# Patient Record
Sex: Male | Born: 1970 | Hispanic: Yes | Marital: Married | Smoking: Former smoker
Health system: Southern US, Community
[De-identification: ages and names within clinical notes are randomized; demographics above are authoritative.]

## PROBLEM LIST (undated history)

## (undated) DIAGNOSIS — I10 Essential (primary) hypertension: Secondary | ICD-10-CM

## (undated) HISTORY — PX: COLONOSCOPY: SHX174

## (undated) HISTORY — DX: Essential (primary) hypertension: I10

## (undated) HISTORY — PX: LUNG SURGERY: SHX703

---

## 2000-01-24 ENCOUNTER — Encounter: Payer: Self-pay | Admitting: Emergency Medicine

## 2000-01-24 ENCOUNTER — Emergency Department (HOSPITAL_COMMUNITY): Admission: EM | Admit: 2000-01-24 | Discharge: 2000-01-24 | Payer: Self-pay | Admitting: Emergency Medicine

## 2000-01-28 ENCOUNTER — Emergency Department (HOSPITAL_COMMUNITY): Admission: EM | Admit: 2000-01-28 | Discharge: 2000-01-28 | Payer: Self-pay | Admitting: Emergency Medicine

## 2000-03-15 ENCOUNTER — Inpatient Hospital Stay (HOSPITAL_COMMUNITY): Admission: EM | Admit: 2000-03-15 | Discharge: 2000-03-26 | Payer: Self-pay | Admitting: Emergency Medicine

## 2000-03-16 ENCOUNTER — Encounter: Payer: Self-pay | Admitting: Internal Medicine

## 2000-03-18 ENCOUNTER — Encounter: Payer: Self-pay | Admitting: Internal Medicine

## 2000-03-20 ENCOUNTER — Encounter: Payer: Self-pay | Admitting: Internal Medicine

## 2000-03-21 ENCOUNTER — Encounter: Payer: Self-pay | Admitting: Thoracic Surgery

## 2000-03-22 ENCOUNTER — Encounter: Payer: Self-pay | Admitting: Thoracic Surgery

## 2000-03-23 ENCOUNTER — Encounter: Payer: Self-pay | Admitting: Thoracic Surgery

## 2000-03-24 ENCOUNTER — Encounter: Payer: Self-pay | Admitting: Thoracic Surgery

## 2000-03-26 ENCOUNTER — Encounter: Payer: Self-pay | Admitting: Thoracic Surgery

## 2000-04-01 ENCOUNTER — Encounter: Admission: RE | Admit: 2000-04-01 | Discharge: 2000-04-01 | Payer: Self-pay | Admitting: Thoracic Surgery

## 2000-04-01 ENCOUNTER — Encounter: Payer: Self-pay | Admitting: Thoracic Surgery

## 2000-04-22 ENCOUNTER — Encounter: Admission: RE | Admit: 2000-04-22 | Discharge: 2000-04-22 | Payer: Self-pay | Admitting: Thoracic Surgery

## 2000-04-22 ENCOUNTER — Encounter: Payer: Self-pay | Admitting: Thoracic Surgery

## 2000-05-28 ENCOUNTER — Encounter: Payer: Self-pay | Admitting: Thoracic Surgery

## 2000-05-28 ENCOUNTER — Encounter: Admission: RE | Admit: 2000-05-28 | Discharge: 2000-05-28 | Payer: Self-pay | Admitting: Thoracic Surgery

## 2000-09-16 ENCOUNTER — Encounter: Admission: RE | Admit: 2000-09-16 | Discharge: 2000-09-16 | Payer: Self-pay | Admitting: Thoracic Surgery

## 2000-09-16 ENCOUNTER — Encounter: Payer: Self-pay | Admitting: Thoracic Surgery

## 2017-09-04 ENCOUNTER — Ambulatory Visit: Payer: Self-pay | Admitting: Family Medicine

## 2018-11-15 DIAGNOSIS — Z20828 Contact with and (suspected) exposure to other viral communicable diseases: Secondary | ICD-10-CM | POA: Diagnosis not present

## 2019-06-30 ENCOUNTER — Other Ambulatory Visit: Payer: Self-pay

## 2019-06-30 ENCOUNTER — Ambulatory Visit (INDEPENDENT_AMBULATORY_CARE_PROVIDER_SITE_OTHER): Payer: BC Managed Care – PPO | Admitting: Emergency Medicine

## 2019-06-30 ENCOUNTER — Encounter: Payer: Self-pay | Admitting: Emergency Medicine

## 2019-06-30 VITALS — BP 155/99 | HR 83 | Temp 98.0°F | Resp 16 | Ht 64.0 in | Wt 149.0 lb

## 2019-06-30 DIAGNOSIS — Z23 Encounter for immunization: Secondary | ICD-10-CM | POA: Diagnosis not present

## 2019-06-30 DIAGNOSIS — F17200 Nicotine dependence, unspecified, uncomplicated: Secondary | ICD-10-CM | POA: Insufficient documentation

## 2019-06-30 DIAGNOSIS — Z7689 Persons encountering health services in other specified circumstances: Secondary | ICD-10-CM

## 2019-06-30 DIAGNOSIS — I10 Essential (primary) hypertension: Secondary | ICD-10-CM

## 2019-06-30 MED ORDER — LOSARTAN POTASSIUM-HCTZ 50-12.5 MG PO TABS: 1.0000 | ORAL_TABLET | Freq: Every day | ORAL | 3 refills | Status: DC

## 2019-06-30 MED ORDER — CHANTIX STARTING MONTH PAK 0.5 MG X 11 & 1 MG X 42 PO TABS: ORAL_TABLET | ORAL | 0 refills | Status: DC

## 2019-06-30 NOTE — Patient Instructions (Addendum)
If you have lab work done today you will be contacted with your lab results within the next 2 weeks.  If you have not heard from [place] then please contact us. The fastest way to get your results is to register for My Chart.   IF you received an x-ray today, you will receive an invoice from [facility]. Please contact [facility] at [phone] with questions or concerns regarding your invoice.   IF you received labwork today, you will receive an invoice from [facility]. Please contact [facility] at [phone] with questions or concerns regarding your invoice.   Our billing staff will not be able to assist you with questions regarding bills from these companies.  You will be contacted with the lab results as soon as they are available. The fastest way to get your results is to activate your My Chart account. Instructions are located on the last page of this paperwork. If you have not heard from [place] regarding the results in 2 weeks, please contact this office.     Cmo sobrellevar la situacin cuando deja de fumar Coping with Quitting Smoking  Dejar de fumar constituye un desafo fsico y mental. Deber enfrentarse al deseo intenso, a los sntomas de abstinencia y a Geographical information systems officer. Antes de dejar de fumar, trabaje con el mdico para elaborar un plan que pueda ayudarlo a sobrellevar la situacin. La preparacin puede ayudarlo a abandonar el hbito y a impedir que caiga en la tentacin. Cmo puedo lidiar con el deseo intenso? El deseo intenso suele durar entre 5y 29mnutos. Si logra atravesar ese perodo, el deseo intenso pasar. Considere las siguientes medidas que lo ayudarn a lidiar con el deseo intenso:  Mantenga la boca ocupada: ? Mastique goma de mascar sin azcar. ? Chupe caramelos duros o un sorbete. ? Cepllese los dientes.  Mantenga las manos y el cuerpo ocupados: ? Cambie de actividad inmediatamente cuando sienta un deseo intenso de fumar. ? Apriete o juegue  con uKeySpan ? Realice alguna actividad o algn pasatiempo, por ejemplo, haga alhajas con cuentas, practique bordado sobre caamazo o trabaje con m[place] ? Modifique su rutina habitual. ? Tome un breve descanso para hacer ejercicio. Haga una caminata rpida o suba y baje las escaleras corriendo. ? Pase tiempo en lugares pblicos en los que no est permitido fumar.  Concntrese en hacer algo bueno o til para otra persona.  Llame a algn amigo o familiar para hablar durante el deseo intenso.  nase a un grupo de apoyo.  Llame a alguna lnea de ayuda, como 1-800-QUIT-NOW.  Hable con su mdico sobre medicamentos que puedan ayudarlo a lidiar con el deseo intenso y fArmed forces technical officerel abandono del hbito. Cmo puedo lidiar con los sntomas de abstinencia? Es posible que su organismo sufra efectos negativos mientras trata de acostumbrarse a la ausencia de nicotina en eSterling Estos efectos se llaman sntomas de abstinencia. Pueden incluir los siguientes:  Ms apetito de lo normal.  Dificultad para concentrarse.  Irritabilidad.  Dificultad para dormir.  Sensacin de depresin.  Inquietud y aAir cabin crew  Deseo intenso de fumar un cigarrillo. Para manejar los sntomas de abstinencia:  Evite los lugares, las personas y las actividades que desencadenen el deseo intenso.  Recuerde por qu quiere dejar de fumar.  Duerma lo suficiente.  Evite el caf y oSid Falconbebidas con cafena. Estos pueden empeorar algunos de los sntomas. CDurant Las sLiberty Mediapueden ser difciles cuando est dejando de fumar, en especial, durante  las primeras semanas. Para manejarlas, puede hacer lo siguiente:  [employer], los bares y Pharmacologist en las que las personas podran fumar.  Evite el alcohol.  Retrese de inmediato del lugar si siente necesidad de fumar.  Explqueles a sus familiares y amigos que est dejando de fumar. Pdales  comprensin y 39.  Planifique actividades con amigos o familiares en las que fumar no sea una opcin. Cules son [employer] que puedo lidiar con el estrs? El deseo de fumar puede causar estrs, y el estrs puede provocarle ganas de fumar. Busque maneras de Monsanto Company. Las tcnicas de relajacin pueden ayudarlo. Por ejemplo:  Respire lenta y profundamente, inhalando por la Doran Durand y exhalando por la boca.  Escuche msica suave y relajante.  Hable con un familiar o un amigo sobre el estrs.  Encienda una vela.  Tome un bao de inmersin o [place].  Piense en un lugar apacible. Cules son Emergency planning/management officer en las que puedo prevenir el aumento de peso? Tenga en cuenta que muchas personas aumentan de peso despus de dejar de fumar. Sin embargo, no les sucede a todos. Para no aumentar de peso, implemente un plan antes de dejar de fumar y resptelo despus de dejar el hbito. El plan debe incluir lo siguiente:  Tener colaciones saludables. Cuando tenga un deseo intenso, quizs lo [employer] lo siguiente: ? Comer palomitas de maz naturales, zanahorias crujientes, apio u otras verduras cortadas. ? Masticar goma de Oceanographer.  Cambiar la [place] de comer: ? Srvase porciones pequeas en las comidas. ? Vance Gather 2y 6comidas pequeas a lo largo del TEFL teacher de 1o 2comidas abundantes por da. ? Sea consciente al comer. Nomire televisin ni haga otras cosas que puedan distraerlo mientras come.  Hacer actividad fsica con regularidad: ? Hgase tiempo para la [employer]. Si no tiene tiempo para un entrenamiento prolongado, haga breves tandas de ejercicio durante 5a 54mnutos varias veces por da. ? HEnvironmental managertipo de ejercicio de fortalecimiento, como levantar pesas, y algn tipo de ejercicio aerbico, como correr o nadar.  Beba abundante cantidad de agua u otras bebidas de bajas caloras o sin caloras. BBonnita Nasutientre 6y 8vasos de agua  por da, o como se lo haya indicado el mdico. Resumen  Dejar de fumar constituye un desafo fsico y mental. Deber enfrentarse al deseo intenso, a los sntomas de abstinencia y a la tentacin de volver a fumar. La preparacin puede ayudarlo a medida que enfrenta estos desafos.  Puede lidiar con el deseo intenso manteniendo la boca ocupada (por ejemplo, masticando goma de mHigher education careers adviser; mClear Channel Communicationscuerpo y las manos ocupados; y lMerchandiser, retaila familiares, a amigos o a una lnea de ayuda para personas que quieren dejar de fumar.  Puede lidiar con los sntomas de abstinencia evitando los lugares donde las personas fuman, evitando las bebidas con cafena y dSt. Joe  Consulte a su mdico sobre las dTeachers Insurance and Annuity Associationpuede prevenir el aumento de peso, eEnglish as a second language teacherestrs y mEva Esta informacin no tiene cMarine scientistel consejo del mdico. Asegrese de hacerle al mdico cualquier pregunta que tenga. Document Revised: 10/15/2016 Document Reviewed: 10/15/2016 Elsevier Patient Education  2[place]  Hipertensin en los adultos Hypertension, Adult El trmino hipertensin es otra forma de denominar a la presin arterial elevada. La presin arterial elevada fuerza al corazn a trabajar ms para bombear la sangre. Esto puede causar problemas con el  paso del tiempo. Una lectura de presin arterial est compuesta por 2 nmeros. Hay un nmero superior (sistlico) sobre un nmero inferior (diastlico). Lo ideal es tener la presin arterial por debajo de 120/80. Las elecciones saludables pueden ayudar a Engineer, materials presin arterial, o tal vez necesite medicamentos para bajarla. Cules son las causas? Se desconoce la causa de esta afeccin. Algunas afecciones pueden estar relacionadas con la presin arterial alta. Qu incrementa el riesgo?  Fumar.  Tener diabetes mellitus tipo 2, colesterol alto, o ambos.  No hacer la cantidad suficiente de actividad  fsica o ejercicio.  Tener sobrepeso.  Consumir mucha grasa, azcar, caloras o sal (sodio) en su dieta.  Beber alcohol en exceso.  Tener una enfermedad renal a largo plazo (crnica).  Tener antecedentes familiares de presin arterial alta.  Edad. Los riesgos aumentan con la edad.  Raza. El riesgo es mayor para las Retail banker.  Sexo. Antes de los 45aos, los hombres corren ms [employer]. Despus de los 65aos, las mujeres corren ms [employer].  Tener apnea obstructiva del sueo.  Estrs. Cules son los signos o los sntomas?  Es posible que la presin arterial alta puede no cause sntomas. La presin arterial muy alta (crisis hipertensiva) puede provocar: ? Dolor de [place]. ? Sensaciones de preocupacin o nerviosismo (ansiedad). ? Falta de aire. ? Hemorragia nasal. ? Sensacin de Engineer, site (nuseas). ? Vmitos. ? Cambios en la forma de ver. ? Dolor muy intenso en el pecho. ? Convulsiones. Cmo se trata?  Esta afeccin se trata haciendo cambios saludables en el estilo de vida, por ejemplo: ? Consumir alimentos saludables. ? Hacer ms ejercicio. ? Beber menos alcohol.  El mdico puede recetarle medicamentos si los cambios en el estilo de vida no son suficientes para Child psychotherapist la presin arterial y si: ? El nmero de arriba est por encima de 130. ? El nmero de abajo est por encima de 80.  Su presin arterial personal ideal puede variar. Siga estas instrucciones en su casa: Comida y bebida   Si se lo dicen, siga el plan de alimentacin de DASH (Dietary Approaches to Stop Hypertension, Maneras de alimentarse para detener la hipertensin). Para seguir este plan: ? Llene la mitad del plato de cada comida con frutas y verduras. ? Llene un cuarto del plato de cada comida con cereales integrales. Los cereales integrales incluyen pasta integral, arroz integral y pan integral. ? Coma y beba productos lcteos  con bajo contenido de grasa, como leche descremada o yogur bajo en grasas. ? Llene un cuarto del plato de cada comida con protenas bajas en grasa (magras). Las protenas bajas en grasa incluyen pescado, pollo sin piel, huevos, frijoles y tofu. ? Evite consumir carne grasa, carne curada y procesada, o pollo con piel. ? Evite consumir alimentos prehechos o procesados.  Consuma menos de 1500 mg de sal por da.  No beba alcohol si: ? El mdico le indica que no lo haga. ? Est embarazada, puede estar embarazada o est tratando de quedar embarazada.  Si bebe alcohol: ? Limite la cantidad que bebe a lo siguiente:  De 0 a 1 medida por da para las mujeres.  De 0 a 2 medidas por da para los hombres. ? Est atento a la cantidad de alcohol que hay en las bebidas que toma. En los [place], una medida equivale a una botella de cerveza de 12oz (361m), un vaso de vino de 5oz (1462m o un vaso de unArdelia Mems  bebida alcohlica de alta graduacin de 1oz (67m). Estilo de vida   Trabaje con su mdico para mantenerse en un peso saludable o para perder peso. Pregntele a su mdico cul es el peso recomendable para usted.  Haga al menos 377mutos de ejercicio la maHartford Financiale la seTuckerEstos pueden incluir caminar, nadar o andar en bicicleta.  Realice al menos 30 minutos de ejercicio que fortalezca sus msculos (ejercicios de resistencia) al menos 3 das a la seWanamingoEstos pueden incluir levantar pesas o hacer [place].  No consuma ningn producto que contenga nicotina o tabaco, como cigarrillos, cigarrillos electrnicos y tabaco de maHigher education careers adviserSi necesita ayuda para dejar de fumar, consulte al mdMeadWestvaco Controle su presin arterial en su casa tal como le indic el mdico.  Concurra a todas las visitas de seguimiento como se lo haya indicado el mdico. Esto es importante. Medicamentos  ToDelphie venta libre y los recetados solamente como se lo haya indicado el mdico. Siga  cuidadosamente las indicaciones.  No omita las dosis de medicamentos para la presin arterial. Los medicamentos pierden eficacia si omite dosis. El hecho de omitir las dosis tambin auSerbial riesgo de otros problemas.  Pregntele a su mdico a qu efectos secundarios o reacciones a los meCareers information officerComunquese con un mdico si:  Piensa que tiene unMexicoeaccin a los medicamentos que est tomando.  Tiene dolores de cabeza frecuentes (recurrentes).  Se siente mareado.  Tiene hinchazn en los tobillos.  Tiene problemas de visin. Solicite ayuda inmediatamente si:  Siente un dolor de cabeza muy intenso.  Empieza a sentirse desorientado (confundido).  Se siente dbil o adormecido.  Siente que va a desmayarse.  Tiene un dolor muy intenso en las siguientes zonas: ? Pecho. ? Vientre (abdomen).  Vomita ms de una vez.  Tiene dificultad para respirar. Resumen  El trmino hipertensin es otra forma de denominar a la presin arterial elevada.  La presin arterial elevada fuerza al corazn a trabajar ms para bombear la sangre.  Para la maComcastuna presin arterial normal es menor que 120/80.  Las decisiones saludables pueden ayudarle a disminuir su presin arterial. Si no puede bajar su presin arterial mediante decisiones saludables, es posible que deba tomar medicamentos. Esta informacin no tiene coMarine scientistl consejo del mdico. Asegrese de hacerle al mdico cualquier pregunta que tenga. Document Revised: 12/10/2017 Document Reviewed: 12/10/2017 Elsevier Patient Education  20[place]

## 2019-06-30 NOTE — Assessment & Plan Note (Signed)
Willing to quit.  Will start Chantix.  Follow-up in 4 weeks.

## 2019-06-30 NOTE — Assessment & Plan Note (Signed)
Uncontrolled hypertension.  We will start losartan/hydrochlorothiazide 50/12.5 mg daily.  Follow-up in 4 weeks.

## 2019-06-30 NOTE — Progress Notes (Signed)
Lee Watson 49 y.o.   Chief Complaint  Patient presents with  . Establish Care    per patient want to stop smoking  . Hypertension    per pt was medication     HISTORY OF PRESENT ILLNESS: This is a 49 y.o. male first visit to this office, here to establish care with me. Has history of hypertension but is presently on no medication. Current smoker.  Wants to quit.  Inquiring about medications. Chronic insomnia. Physical work 5 days/week.  No chest pain on exertion.  No dyspnea on exertion. Occasional sciatica. No other complaints or medical concerns today.  HPI   Prior to Admission medications   Medication Sig Start Date End Date Taking? Authorizing Provider  aspirin 325 MG tablet Take 325 mg by mouth as needed.   Yes [provider]    No Known Allergies  There are no problems to display for this patient.   History reviewed. No pertinent past medical history.  History reviewed. No pertinent surgical history.  Social History   Socioeconomic History  . Marital status: Married    Spouse name: Not on file  . Number of children: Not on file  . Years of education: Not on file  . Highest education level: Not on file  Occupational History  . Not on file  Tobacco Use  . Smoking status: Current Every Day Smoker    Years: 25.00    Types: Cigarettes  . Smokeless tobacco: Never Used  . Tobacco comment: 8-9 a day cigarettes  Substance and Sexual Activity  . Alcohol use: Yes    Comment: cans of beers on weekends  . Drug use: Never  . Sexual activity: Not on file  Other Topics Concern  . Not on file  Social History Narrative  . Not on file   Social Determinants of Health   Financial Resource Strain:   . Difficulty of Paying Living Expenses:   Food Insecurity:   . Worried About Charity fundraiser in the Last Year:   . Arboriculturist in the Last Year:   Transportation Needs:   . Film/video editor (Medical):   Marland Kitchen Lack of Transportation  (Non-Medical):   Physical Activity:   . Days of Exercise per Week:   . Minutes of Exercise per Session:   Stress:   . Feeling of Stress :   Social Connections:   . Frequency of Communication with Friends and Family:   . Frequency of Social Gatherings with Friends and Family:   . Attends Religious Services:   . Active Member of Clubs or Organizations:   . Attends Archivist Meetings:   Marland Kitchen Marital Status:   Intimate Partner Violence:   . Fear of Current or Ex-Partner:   . Emotionally Abused:   Marland Kitchen Physically Abused:   . Sexually Abused:     History reviewed. No pertinent family history.   Review of Systems  Constitutional: Negative.  Negative for chills and fever.  HENT: Negative.  Negative for congestion and sore throat.   Respiratory: Negative.  Negative for cough and shortness of breath.   Cardiovascular: Negative.  Negative for chest pain and palpitations.  Gastrointestinal: Negative.  Negative for abdominal pain, blood in stool, diarrhea, nausea and vomiting.  Genitourinary: Negative.  Negative for dysuria and hematuria.  Musculoskeletal: Positive for back pain.  Skin: Negative.  Negative for rash.  Neurological: Negative.  Negative for dizziness and headaches.  Psychiatric/Behavioral: The patient has insomnia.  All other systems reviewed and are negative.  Vitals:   06/30/19 1518  BP: (!) 155/99  Pulse: 83  Resp: 16  Temp: 98 F (36.7 C)  SpO2: 97%     Physical Exam Vitals reviewed.  Constitutional:      Appearance: Normal appearance.  HENT:     Head: Normocephalic.  Eyes:     Extraocular Movements: Extraocular movements intact.     Conjunctiva/sclera: Conjunctivae normal.     Pupils: Pupils are equal, round, and reactive to light.  Neck:     Vascular: No carotid bruit.  Cardiovascular:     Rate and Rhythm: Normal rate and regular rhythm.     Pulses: Normal pulses.     Heart sounds: Normal heart sounds.  Pulmonary:     Effort: Pulmonary  effort is normal.     Breath sounds: Normal breath sounds.  Abdominal:     General: There is no distension.     Palpations: Abdomen is soft.     Tenderness: There is no abdominal tenderness.  Musculoskeletal:        General: Normal range of motion.     Cervical back: Normal range of motion and neck supple.  Lymphadenopathy:     Cervical: No cervical adenopathy.  Skin:    General: Skin is warm and dry.  Neurological:     General: No focal deficit present.     Mental Status: He is alert and oriented to person, place, and time.  Psychiatric:        Mood and Affect: Mood normal.        Behavior: Behavior normal.    A total of 45 minutes was spent with the patient, greater than 50% of which was in counseling/coordination of care regarding chronic medical problems, hypertension and treatment, dangers of smoking and cardiovascular risks associated with these conditions, need for blood work today, review of all new medications Chantix and losartan, diet and nutrition, prognosis and need for follow-up in 4 weeks.   ASSESSMENT & PLAN: Current smoker Willing to quit.  Will start Chantix.  Follow-up in 4 weeks.  Essential hypertension Uncontrolled hypertension.  We will start losartan/hydrochlorothiazide 50/12.5 mg daily.  Follow-up in 4 weeks.  [name] was seen today for establish care and hypertension.  Diagnoses and all orders for this visit:  Essential hypertension -     CBC with Differential/Platelet -     Comprehensive metabolic panel -     Lipid panel -     Hemoglobin A1c -     losartan-hydrochlorothiazide (HYZAAR) 50-12.5 MG tablet; Take 1 tablet by mouth daily.  Encounter to establish care  Current smoker -     varenicline (CHANTIX STARTING MONTH PAK) 0.5 MG X 11 & 1 MG X 42 tablet; Take one 0.5 mg tablet by mouth once daily for 3 days, then increase to one 0.5 mg tablet twice daily for 4 days, then increase to one 1 mg tablet twice daily.  Need for  diphtheria-tetanus-pertussis (Tdap) vaccine  Other orders -     Tdap vaccine greater than or equal to 7yo IM    Patient Instructions       If you have lab work done today you will be contacted with your lab results within the next 2 weeks.  If you have not heard from [place] then please contact us. The fastest way to get your results is to register for My Chart.   IF you received an x-ray today, you will receive an invoice  from [facility]. Please contact [facility] at [phone] with questions or concerns regarding your invoice.   IF you received labwork today, you will receive an invoice from [facility]. Please contact [facility] at [phone] with questions or concerns regarding your invoice.   Our billing staff will not be able to assist you with questions regarding bills from these companies.  You will be contacted with the lab results as soon as they are available. The fastest way to get your results is to activate your My Chart account. Instructions are located on the last page of this paperwork. If you have not heard from [place] regarding the results in 2 weeks, please contact this office.     Cmo sobrellevar la situacin cuando deja de fumar Coping with Quitting Smoking  Dejar de fumar constituye un desafo fsico y mental. Deber enfrentarse al deseo intenso, a los sntomas de abstinencia y a Geographical information systems officer. Antes de dejar de fumar, trabaje con el mdico para elaborar un plan que pueda ayudarlo a sobrellevar la situacin. La preparacin puede ayudarlo a abandonar el hbito y a impedir que caiga en la tentacin. Cmo puedo lidiar con el deseo intenso? El deseo intenso suele durar entre 5y 33mnutos. Si logra atravesar ese perodo, el deseo intenso pasar. Considere las siguientes medidas que lo ayudarn a lidiar con el deseo intenso:  Mantenga la boca ocupada: ? Mastique goma de mascar sin azcar. ? Chupe caramelos duros o un sorbete. ? Cepllese los  dientes.  Mantenga las manos y el cuerpo ocupados: ? Cambie de actividad inmediatamente cuando sienta un deseo intenso de fumar. ? Apriete o juegue con uKeySpan ? Realice alguna actividad o algn pasatiempo, por ejemplo, haga alhajas con cuentas, practique bordado sobre caamazo o trabaje con mBennett ? Modifique su rutina habitual. ? Tome un breve descanso para hacer ejercicio. Haga una caminata rpida o suba y baje las escaleras corriendo. ? Pase tiempo en lugares pblicos en los que no est permitido fumar.  Concntrese en hacer algo bueno o til para otra persona.  Llame a algn amigo o familiar para hablar durante el deseo intenso.  nase a un grupo de apoyo.  Llame a alguna lnea de ayuda, como 1-800-QUIT-NOW.  Hable con su mdico sobre medicamentos que puedan ayudarlo a lidiar con el deseo intenso y fArmed forces technical officerel abandono del hbito. Cmo puedo lidiar con los sntomas de abstinencia? Es posible que su organismo sufra efectos negativos mientras trata de acostumbrarse a la ausencia de nicotina en eStouchsburg Estos efectos se llaman sntomas de abstinencia. Pueden incluir los siguientes:  Ms apetito de lo normal.  Dificultad para concentrarse.  Irritabilidad.  Dificultad para dormir.  Sensacin de depresin.  Inquietud y aAir cabin crew  Deseo intenso de fumar un cigarrillo. Para manejar los sntomas de abstinencia:  Evite los lugares, las personas y las actividades que desencadenen el deseo intenso.  Recuerde por qu quiere dejar de fumar.  Duerma lo suficiente.  Evite el caf y oSid Falconbebidas con cafena. Estos pueden empeorar algunos de los sntomas. CSewall's Point Las sLiberty Mediapueden ser difciles cuando est dejando de fumar, en especial, durante las primeras semanas. Para manejarlas, puede hacer lo siguiente:  ECharles Schwab los bares y oPharmacologisten las que las personas podran fumar.  Evite el  alcohol.  Retrese de inmediato del lugar si siente necesidad de fumar.  Explqueles a sus familiares y amigos que est dejando de fumar. Pdales comprensin y a64  Planifique actividades  con amigos o familiares en las que fumar no sea una opcin. Cules son [employer] Co que puedo lidiar con el estrs? El deseo de fumar puede causar estrs, y el estrs puede provocarle ganas de fumar. Busque maneras de Monsanto Company. Las tcnicas de relajacin pueden ayudarlo. Por ejemplo:  Respire lenta y profundamente, inhalando por la Doran Durand y exhalando por la boca.  Escuche msica suave y relajante.  Hable con un familiar o un amigo sobre el estrs.  Encienda una vela.  Tome un bao de inmersin o [place].  Piense en un lugar apacible. Cules son Emergency planning/management officer en las que puedo prevenir el aumento de peso? Tenga en cuenta que muchas personas aumentan de peso despus de dejar de fumar. Sin embargo, no les sucede a todos. Para no aumentar de peso, implemente un plan antes de dejar de fumar y resptelo despus de dejar el hbito. El plan debe incluir lo siguiente:  Tener colaciones saludables. Cuando tenga un deseo intenso, quizs lo [employer] lo siguiente: ? Comer palomitas de maz naturales, zanahorias crujientes, apio u otras verduras cortadas. ? Masticar goma de Oceanographer.  Cambiar la [place] de comer: ? Srvase porciones pequeas en las comidas. ? Vance Gather 2y 6comidas pequeas a lo largo del TEFL teacher de 1o 2comidas abundantes por da. ? Sea consciente al comer. Nomire televisin ni haga otras cosas que puedan distraerlo mientras come.  Hacer actividad fsica con regularidad: ? Hgase tiempo para la [employer]. Si no tiene tiempo para un entrenamiento prolongado, haga breves tandas de ejercicio durante 5a 93mnutos varias veces por da. ? HEnvironmental managertipo de ejercicio de fortalecimiento, como levantar pesas, y algn tipo de  ejercicio aerbico, como correr o nadar.  Beba abundante cantidad de agua u otras bebidas de bajas caloras o sin caloras. BBonnita Nasutientre 6y 8vasos de agua por da, o como se lo haya indicado el mdico. Resumen  Dejar de fumar constituye un desafo fsico y mental. Deber enfrentarse al deseo intenso, a los sntomas de abstinencia y a la tentacin de volver a fumar. La preparacin puede ayudarlo a medida que enfrenta estos desafos.  Puede lidiar con el deseo intenso manteniendo la boca ocupada (por ejemplo, masticando goma de mHigher education careers adviser; mClear Channel Communicationscuerpo y las manos ocupados; y lMerchandiser, retaila familiares, a amigos o a una lnea de ayuda para personas que quieren dejar de fumar.  Puede lidiar con los sntomas de abstinencia evitando los lugares donde las personas fuman, evitando las bebidas con cafena y dStone [place]  Consulte a su mdico sobre las dTeachers Insurance and Annuity Associationpuede prevenir el aumento de peso, eEnglish as a second language teacherestrs y mMount Sterling Esta informacin no tiene cMarine scientistel consejo del mdico. Asegrese de hacerle al mdico cualquier pregunta que tenga. Document Revised: 10/15/2016 Document Reviewed: 10/15/2016 Elsevier Patient Education  2[place]  Hipertensin en los adultos Hypertension, Adult El trmino hipertensin es otra forma de denominar a la presin arterial elevada. La presin arterial elevada fuerza al corazn a trabajar ms para bombear la sangre. Esto puede causar problemas con el paso del tTabiona Una lectura de presin arterial est compuesta por 2 nmeros. Hay un nmero superior (sistlico) sobre un nmero inferior (diastlico). Lo ideal es tener la presin arterial por debajo de 120/80. Las elecciones saludables pueden ayudar a bEngineer, materialspresin arterial, o tal vez necesite medicamentos para bajarla. Cules son las causas? Se desconoce la causa de esta  afeccin. Algunas afecciones pueden estar relacionadas con la presin arterial  alta. Qu incrementa el riesgo?  Fumar.  Tener diabetes mellitus tipo 2, colesterol alto, o ambos.  No hacer la cantidad suficiente de actividad fsica o ejercicio.  Tener sobrepeso.  Consumir mucha grasa, azcar, caloras o sal (sodio) en su dieta.  Beber alcohol en exceso.  Tener una enfermedad renal a largo plazo (crnica).  Tener antecedentes familiares de presin arterial alta.  Edad. Los riesgos aumentan con la edad.  Raza. El riesgo es mayor para las Retail banker.  Sexo. Antes de los 45aos, los hombres corren ms [employer]. Despus de los 65aos, las mujeres corren ms [employer]M [employer].  Tener apnea obstructiva del sueo.  Estrs. Cules son los signos o los sntomas?  Es posible que la presin arterial alta puede no cause sntomas. La presin arterial muy alta (crisis hipertensiva) puede provocar: ? Dolor de [place]. ? Sensaciones de preocupacin o nerviosismo (ansiedad). ? Falta de aire. ? Hemorragia nasal. ? Sensacin de Engineer, site (nuseas). ? Vmitos. ? Cambios en la forma de ver. ? Dolor muy intenso en el pecho. ? Convulsiones. Cmo se trata?  Esta afeccin se trata haciendo cambios saludables en el estilo de vida, por ejemplo: ? Consumir alimentos saludables. ? Hacer ms ejercicio. ? Beber menos alcohol.  El mdico puede recetarle medicamentos si los cambios en el estilo de vida no son suficientes para Child psychotherapist la presin arterial y si: ? El nmero de arriba est por encima de 130. ? El nmero de abajo est por encima de 80.  Su presin arterial personal ideal puede variar. Siga estas instrucciones en su casa: Comida y bebida   Si se lo dicen, siga el plan de alimentacin de DASH (Dietary Approaches to Stop Hypertension, Maneras de alimentarse para detener la hipertensin). Para seguir este plan: ? Llene la mitad del plato de cada comida con frutas y verduras. ? Llene un cuarto del plato de  cada comida con cereales integrales. Los cereales integrales incluyen pasta integral, arroz integral y pan integral. ? Coma y beba productos lcteos con bajo contenido de grasa, como leche descremada o yogur bajo en grasas. ? Llene un cuarto del plato de cada comida con protenas bajas en grasa (magras). Las protenas bajas en grasa incluyen pescado, pollo sin piel, huevos, frijoles y tofu. ? Evite consumir carne grasa, carne curada y procesada, o pollo con piel. ? Evite consumir alimentos prehechos o procesados.  Consuma menos de 1500 mg de sal por da.  No beba alcohol si: ? El mdico le indica que no lo haga. ? Est embarazada, puede estar embarazada o est tratando de quedar embarazada.  Si bebe alcohol: ? Limite la cantidad que bebe a lo siguiente:  De 0 a 1 medida por da para las mujeres.  De 0 a 2 medidas por da para los hombres. ? Est atento a la cantidad de alcohol que hay en las bebidas que toma. En los [place], una medida equivale a una botella de cerveza de 12oz (356m), un vaso de vino de 5oz (1468m o un vaso de una bebida alcohlica de alta graduacin de 1oz (4421m Estilo de vida   Trabaje con su mdico para mantenerse en un peso saludable o para perder peso. Pregntele a su mdico cul es el peso recomendable para usted.  Haga al menos 41m42mos de ejercicio la mayoHartford Financialla semaTennanttos pueden incluir caminar, nadar o andar en  bicicleta.  Realice al menos 30 minutos de ejercicio que fortalezca sus msculos (ejercicios de resistencia) al menos 3 das a la [place]. Estos pueden incluir levantar pesas o hacer [place].  No consuma ningn producto que contenga nicotina o tabaco, como cigarrillos, cigarrillos electrnicos y tabaco de Higher education careers adviser. Si necesita ayuda para dejar de fumar, consulte al MeadWestvaco.  Controle su presin arterial en su casa tal como le indic el mdico.  Concurra a todas las visitas de seguimiento como se lo haya indicado el  mdico. Esto es importante. Medicamentos  Delphi de venta libre y los recetados solamente como se lo haya indicado el mdico. Siga cuidadosamente las indicaciones.  No omita las dosis de medicamentos para la presin arterial. Los medicamentos pierden eficacia si omite dosis. El hecho de omitir las dosis tambin [place] el riesgo de otros problemas.  Pregntele a su mdico a qu efectos secundarios o reacciones a los Careers information officer. Comunquese con un mdico si:  Piensa que tiene [place] reaccin a los medicamentos que est tomando.  Tiene dolores de cabeza frecuentes (recurrentes).  Se siente mareado.  Tiene hinchazn en los tobillos.  Tiene problemas de visin. Solicite ayuda inmediatamente si:  Siente un dolor de cabeza muy intenso.  Empieza a sentirse desorientado (confundido).  Se siente dbil o adormecido.  Siente que va a desmayarse.  Tiene un dolor muy intenso en las siguientes zonas: ? Pecho. ? Vientre (abdomen).  Vomita ms de una vez.  Tiene dificultad para respirar. Resumen  El trmino hipertensin es otra forma de denominar a la presin arterial elevada.  La presin arterial elevada fuerza al corazn a trabajar ms para bombear la sangre.  Para la [employer]cast, una presin arterial normal es menor que 120/80.  Las decisiones saludables pueden ayudarle a disminuir su presin arterial. Si no puede bajar su presin arterial mediante decisiones saludables, es posible que deba tomar medicamentos. Esta informacin no tiene Marine scientist el consejo del mdico. Asegrese de hacerle al mdico cualquier pregunta que tenga. Document Revised: 12/10/2017 Document Reviewed: 12/10/2017 Elsevier Patient Education  2020 Elsevier Inc.      Agustina Caroli, MD [facility]

## 2019-07-01 LAB — COMPREHENSIVE METABOLIC PANEL
ALT: 31 IU/L (ref 0–44)
AST: 29 IU/L (ref 0–40)
Albumin/Globulin Ratio: 2.1 (ref 1.2–2.2)
Albumin: 4.9 g/dL (ref 3.8–4.9)
Alkaline Phosphatase: 92 IU/L (ref 39–117)
BUN/Creatinine Ratio: 15 (ref 9–20)
BUN: 15 mg/dL (ref 6–24)
Bilirubin Total: 0.3 mg/dL (ref 0.0–1.2)
CO2: 25 mmol/L (ref 20–29)
Calcium: 9.7 mg/dL (ref 8.7–10.2)
Chloride: 103 mmol/L (ref 96–106)
Creatinine, Ser: 0.97 mg/dL (ref 0.76–1.27)
GFR calc Af Amer: 104 mL/min/{1.73_m2} (ref >59)
GFR calc non Af Amer: 90 mL/min/{1.73_m2} (ref >59)
Globulin, Total: 2.3 g/dL (ref 1.5–4.5)
Glucose: 88 mg/dL (ref 65–99)
Potassium: 4.3 mmol/L (ref 3.5–5.2)
Sodium: 142 mmol/L (ref 134–144)
Total Protein: 7.2 g/dL (ref 6.0–8.5)

## 2019-07-01 LAB — CBC WITH DIFFERENTIAL/PLATELET
Basophils Absolute: 0.1 10*3/uL (ref 0.0–0.2)
Basos: 1 %
EOS (ABSOLUTE): 0.1 10*3/uL (ref 0.0–0.4)
Eos: 1 %
Hematocrit: 43.9 % (ref 37.5–51.0)
Hemoglobin: 15 g/dL (ref 13.0–17.7)
Immature Grans (Abs): 0.1 10*3/uL (ref 0.0–0.1)
Immature Granulocytes: 1 %
Lymphocytes Absolute: 1.9 10*3/uL (ref 0.7–3.1)
Lymphs: 18 %
MCH: 30.8 pg (ref 26.6–33.0)
MCHC: 34.2 g/dL (ref 31.5–35.7)
MCV: 90 fL (ref 79–97)
Monocytes Absolute: 1.1 10*3/uL — ABNORMAL HIGH (ref 0.1–0.9)
Monocytes: 10 %
Neutrophils Absolute: 7.8 10*3/uL — ABNORMAL HIGH (ref 1.4–7.0)
Neutrophils: 69 %
Platelets: 290 10*3/uL (ref 150–450)
RBC: 4.87 x10E6/uL (ref 4.14–5.80)
RDW: 12.5 % (ref 11.6–15.4)
WBC: 11.1 10*3/uL — ABNORMAL HIGH (ref 3.4–10.8)

## 2019-07-01 LAB — LIPID PANEL
Chol/HDL Ratio: 2.2 ratio (ref 0.0–5.0)
Cholesterol, Total: 153 mg/dL (ref 100–199)
HDL: 70 mg/dL (ref >39)
LDL Chol Calc (NIH): 69 mg/dL (ref 0–99)
Triglycerides: 71 mg/dL (ref 0–149)
VLDL Cholesterol Cal: 14 mg/dL (ref 5–40)

## 2019-07-01 LAB — HEMOGLOBIN A1C
Est. average glucose Bld gHb Est-mCnc: 108 mg/dL
Hgb A1c MFr Bld: 5.4 % (ref 4.8–5.6)

## 2019-07-05 NOTE — Progress Notes (Signed)
Spoke with pt to give normal lab results, also sending letter.

## 2019-07-05 NOTE — Progress Notes (Signed)
Called pt, lm to return call for lab results

## 2019-07-28 ENCOUNTER — Encounter: Payer: Self-pay | Admitting: Emergency Medicine

## 2019-07-28 ENCOUNTER — Ambulatory Visit (INDEPENDENT_AMBULATORY_CARE_PROVIDER_SITE_OTHER): Payer: BC Managed Care – PPO | Admitting: Emergency Medicine

## 2019-07-28 ENCOUNTER — Other Ambulatory Visit: Payer: Self-pay

## 2019-07-28 VITALS — BP 131/87 | HR 67 | Temp 98.3°F | Resp 16 | Ht 65.0 in | Wt 148.0 lb

## 2019-07-28 DIAGNOSIS — I10 Essential (primary) hypertension: Secondary | ICD-10-CM

## 2019-07-28 DIAGNOSIS — F17200 Nicotine dependence, unspecified, uncomplicated: Secondary | ICD-10-CM

## 2019-07-28 MED ORDER — VARENICLINE TARTRATE 1 MG PO TABS: 1.0000 mg | ORAL_TABLET | Freq: Two times a day (BID) | ORAL | 3 refills | Status: AC

## 2019-07-28 NOTE — Patient Instructions (Addendum)
If you have lab work done today you will be contacted with your lab results within the next 2 weeks.  If you have not heard from [place] then please contact us. The fastest way to get your results is to register for My Chart.   IF you received an x-ray today, you will receive an invoice from [facility]. Please contact [facility] at [phone] with questions or concerns regarding your invoice.   IF you received labwork today, you will receive an invoice from [place]. Please contact [facility] at [phone] with questions or concerns regarding your invoice.   Our billing staff will not be able to assist you with questions regarding bills from these companies.  You will be contacted with the lab results as soon as they are available. The fastest way to get your results is to activate your My Chart account. Instructions are located on the last page of this paperwork. If you have not heard from [place] regarding the results in 2 weeks, please contact this office.     Plan de alimentacin DASH DASH Eating Plan DASH es la sigla en ingls de "Enfoques Alimentarios para Detener la Hipertensin" (Dietary Approaches to Stop Hypertension). El plan de alimentacin DASH ha demostrado bajar la presin arterial elevada (hipertensin). Tambin puede reducir [employer] de diabetes tipo 2, enfermedad cardaca y accidente cerebrovascular. Este plan tambin puede ayudar a Horticulturist, commercial. Consejos para seguir este plan  Pautas generales  Evite ingerir ms de 2,300 mg (miligramos) de sal (sodio) por da. Si tiene hipertensin, es posible que necesite reducir la ingesta de sodio a 1,500 mg por da.  Limite el consumo de alcohol a no ms de 20mdida por da si es mujer y no est [place] y 269midas por da si es hombre. Una medida equivale a 12oz (35524mde cerveza, 5oz (148m75me vino o 1oz (44ml39m bebidas alcohlicas de alta graduacin.  Trabaje con su mdico para mantener un peso  saludable o perder peso.[place] Mediagntele cul es el peso recomendado para usted.  Realice al menos 30 minutos de ejercicio que haga que se acelere su corazn (ejercicio aerbiArboriculturistmayorHartford Financiala semanCasas Adobesas actividades pueden incluir caminar, nadar o andar en bicicleta.  Trabaje con su mdico o especialista en alimentacin y nutricin (nutricionista) para ajustar su plan alimentario a sus necesidades calricas personales. Lectura de las etiquetas de los alimentos   Verifique en las etiquetas de los alimentos, la cantidad de sodio por porcin. Elija alimentos con menos del 5 por ciento del valor diario de sodio. Generalmente, los alimentos con menos de 300 mg de sodio por porcin se encuadran dentro de este plan alimentario.  Para encontrar cereales integrales, busque la palabra "integral" como primera palabra en la lista de ingredientes. De compras  Compre productos en los que en su etiqueta diga: "bajo contenido de sodio" o "sin agregado de sal".  Compre alimentos frescos. Evite los alimentos enlatados y comidas precocidas o congeladas. Coccin  Evite agregar sal cuando cocine. Use hierbas o aderezos sin sal, en lugar de sal de mesa o sal marina. Consulte al mdico o farmacutico antes de usar sustitutos de la sal.  No fra los alimentos. A la hora de cocinar los alimentos opte por hornearlos, hervirlos, grillarlos y asarlos a la paAdministrator, artscine con aceites cardiosaludables, como oliva, canola, soja o girasol. Planificacin de las comidas  Consuma una dieta equilibrada, que incluya lo siguiente: ? 5o ms porciones de frutas y verduras por  da. Trate de que la mitad del plato de cada comida sean frutas y verduras. ? Hasta 6 u 8 porciones de cereales integrales por da. ? Menos de 6 onzas de carne, aves o pescado Games developer. Una porcin de 3 onzas de carne tiene casi el mismo tamao que un mazo de cartas. Un huevo equivale a 1 onza. ? Dos porciones de productos lcteos  descremados por Training and development officer. ? Una porcin de frutos secos, semillas o frijoles 5 veces por semana. ? Grasas cardiosaludables. Las grasas saludables llamadas cidos grasos omega-3 se encuentran en alimentos como semillas de lino y pescados de agua fra, como por ejemplo, sardinas, salmn y caballa.  Limite la cantidad que ingiere de los siguientes alimentos: ? Alimentos enlatados o envasados. ? Alimentos con alto contenido de grasa trans, como alimentos fritos. ? Alimentos con alto contenido de grasa saturada, como carne con grasa. ? Dulces, postres, bebidas azucaradas y otros alimentos con azcar agregada. ? Productos lcteos enteros.  No le agregue sal a los alimentos antes de probarlos.  Trate de comer al menos 2 comidas vegetarianas por semana.  Consuma ms comida casera y menos de restaurante, de bufs y comida rpida.  Cuando coma en un restaurante, pida que preparen su comida con menos sal o, en lo posible, sin nada de sal. Qu alimentos se recomiendan? Los alimentos enumerados a continuacin no constituyen Furniture conservator/restorer. Hable con el nutricionista sobre las mejores opciones alimenticias para usted. Cereales Pan de salvado o integral. Pasta de salvado o integral. Arroz integral. Avena. Quinua. Trigo burgol. Cereales integrales y con bajo contenido de sodio. Pan pita. Galletitas de [place] con bajo contenido de [place] y [place]. Tortillas de [place] integral. Verduras Verduras frescas o congeladas (crudas, al vapor, asadas o grilladas). Jugos de tomate y verduras con bajo contenido de sodio o reducidos en sodio. Salsa y pasta de tomate con bajo contenido de sodio o reducidas en sodio. Verduras enlatadas con bajo contenido de sodio o reducidas en sodio. Frutas Todas las frutas frescas, congeladas o disecadas. Frutas enlatadas en jugo natural (sin agregado de azcar). Carne y otros alimentos proteicos Pollo o pavo sin piel. Carne de pollo o de [place]. Cerdo desgrasado. Pescado y [employer].  Claras de huevo. Porotos, guisantes o lentejas secos. Frutos secos, mantequilla de frutos secos y semillas sin sal. Frijoles enlatados sin sal. Cortes de carne vacuna magra, desgrasada. Embutidos magros, con bajo contenido de [place]. Lcteos Leche descremada (1%) o descremada. Quesos sin grasa, con bajo contenido de grasa o descremados. Queso blanco o ricota sin grasa, con bajo contenido de [place]. Yogur semidescremado o descremado. Queso con bajo contenido de [place] y [place]. Grasas y [employer] untables que no contengan grasas trans. Aceite vegetal. Lubertha Basque y aderezos para ensaladas livianos o con bajo contenido de grasas (reducidos en sodio). Aceite de canola, crtamo, oliva, soja y [place]. [place]. Condimentos y otros alimentos Hierbas. Especias. Mezclas de condimentos sin sal. Palomitas de maz y pretzels sin sal. Dulces con bajo contenido de grasas. Qu alimentos no se recomiendan? Los alimentos enumerados a continuacin no constituyen Furniture conservator/restorer. Hable con el nutricionista sobre las mejores opciones alimenticias para usted. Cereales Productos de panificacin hechos con grasa, como medialunas, magdalenas y algunos panes. Comidas con arroz o pasta seca listas para usar. Verduras Verduras con crema o fritas. Verduras en [place]. Verduras enlatadas regulares (que no sean con bajo contenido de sodio o reducidas en sodio). Pasta y salsa de tomates enlatadas regulares (que no sean  con bajo contenido de sodio o reducidas en sodio). Jugos de tomate y verduras regulares (que no sean con bajo contenido de sodio o reducidos en sodio). Pepinillos. Aceitunas. Frutas Fruta enlatada en almbar liviano o espeso. Frutas cocidas en aceite. Frutas con salsa de crema o manteca. Carne y otros alimentos proteicos Cortes de carne con grasa. Costillas. Carne frita. [place]. Salchichas. [place] y otras carnes procesadas. Salame. Panceta. Perros calientes (hotdogs). Salchicha de cerdo. Frutos  secos y semillas con sal. Frijoles enlatados con agregado de sal. Pescado enlatado o ahumado. Huevos enteros o yemas. Pollo o pavo con piel. Lcteos Leche entera o al 2%, crema y mitad leche y mitad crema. Queso crema entero o con toda su grasa. Yogur entero o endulzado. Quesos con toda su grasa. Sustitutos de cremas no lcteas. Coberturas batidas. Quesos para untar y quesos procesados. Grasas y aceites Mant[place]. Margarina en barra. Manteca de cerdo. Materia grasa. Mantequilla clarificada. Grasa de panceta. Aceites tropicales como aceite de coco, palmiste o palma. Condimentos y otros alimentos Palomitas de maz y pretzels con sal. Sal de cebolla, sal de ajo, sal condimentada, sal de mesa y sal marina. Salsa [place]. Salsa trtara. Salsa barbacoa. Salsa teriyaki. Salsa de soja, incluso la que tiene contenido reducido de sodio. Salsa de carne. Salsas en lata y envasadas. Salsa de pescado. Salsa de ostras. Salsa rosada. Rbano picante envasado. [place]. [place]. Saborizantes y tiernizantes para carne. Caldo en cubitos. Salsa picante y salsa tabasco. Escabeches envasados o ya preparados. Aderezos para tacos prefabricados o envasados. Salsas. Aderezos comunes para ensalada. Dnde encontrar ms informacin:  Instituto Nacional del Corazn, los Pulmones y la Sangre (National Heart, Lung, and Blood Institute): www.nhlbi.nih.gov  Asociacin Estadounidense del Corazn (American Heart Association): www.heart.org Resumen  El plan de alimentacin DASH ha demostrado bajar la presin arterial elevada (hipertensin). Tambin puede reducir el riesgo de diabetes tipo 2, enfermedad cardaca y accidente cerebrovascular.  Con el plan de alimentacin DASH, deber limitar el consumo de sal (sodio) a 2,300 mg por da. Si tiene hipertensin, es posible que necesite reducir la ingesta de sodio a 1,500 mg por da.  Cuando siga el plan de alimentacin DASH, trate de comer ms frutas frescas y verduras, cereales  integrales, carnes magras, lcteos descremados y grasas cardiosaludables.  Trabaje con su mdico o especialista en alimentacin y nutricin (nutricionista) para ajustar su plan alimentario a sus necesidades calricas personales. Esta informacin no tiene como fin reemplazar el consejo del mdico. Asegrese de hacerle al mdico cualquier pregunta que tenga. Document Revised: 06/16/2016 Document Reviewed: 06/16/2016 Elsevier Patient Education  2020 Elsevier Inc.  

## 2019-07-28 NOTE — Progress Notes (Signed)
Lee Watson 49 y.o.   Chief Complaint  Patient presents with  . Hypertension    follow up    HISTORY OF PRESENT ILLNESS: This is a 49 y.o. male here for follow-up of hypertension on smoking cessation. Doing well.  Compliant with blood pressure medication and Chantix. Chantix working well.  Has not smoked in the past couple days. No complaints or medical concerns today. Recent blood work results reviewed with patient.  CBC, CMP, lipid profile, and hemoglobin A1c all within normal limits. BP Readings from Last 3 Encounters:  07/28/19 131/87  06/30/19 (!) 155/99    HPI   Prior to Admission medications   Medication Sig Start Date End Date Taking? Authorizing Provider  aspirin 325 MG tablet Take 325 mg by mouth as needed.   Yes [provider]  losartan-hydrochlorothiazide (HYZAAR) 50-12.5 MG tablet Take 1 tablet by mouth daily. 06/30/19  Yes Quanetta Truss, Ines Bloomer, MD  varenicline (CHANTIX STARTING MONTH PAK) 0.5 MG X 11 & 1 MG X 42 tablet Take one 0.5 mg tablet by mouth once daily for 3 days, then increase to one 0.5 mg tablet twice daily for 4 days, then increase to one 1 mg tablet twice daily. 06/30/19  Yes SagardiaInes Bloomer, MD    No Known Allergies  Patient Active Problem List   Diagnosis Date Noted  . Essential hypertension 06/30/2019  . Current smoker 06/30/2019    History reviewed. No pertinent past medical history.  History reviewed. No pertinent surgical history.  Social History   Socioeconomic History  . Marital status: Married    Spouse name: Not on file  . Number of children: Not on file  . Years of education: Not on file  . Highest education level: Not on file  Occupational History  . Not on file  Tobacco Use  . Smoking status: Current Every Day Smoker    Years: 25.00    Types: Cigarettes  . Smokeless tobacco: Never Used  . Tobacco comment: 8-9 a day cigarettes  Substance and Sexual Activity  . Alcohol use: Yes    Comment: cans  of beers on weekends  . Drug use: Never  . Sexual activity: Not on file  Other Topics Concern  . Not on file  Social History Narrative  . Not on file   Social Determinants of Health   Financial Resource Strain:   . Difficulty of Paying Living Expenses:   Food Insecurity:   . Worried About Charity fundraiser in the Last Year:   . Arboriculturist in the Last Year:   Transportation Needs:   . Film/video editor (Medical):   Lee Watson Kitchen Lack of Transportation (Non-Medical):   Physical Activity:   . Days of Exercise per Week:   . Minutes of Exercise per Session:   Stress:   . Feeling of Stress :   Social Connections:   . Frequency of Communication with Friends and Family:   . Frequency of Social Gatherings with Friends and Family:   . Attends Religious Services:   . Active Member of Clubs or Organizations:   . Attends Archivist Meetings:   Lee Watson Kitchen Marital Status:   Intimate Partner Violence:   . Fear of Current or Ex-Partner:   . Emotionally Abused:   Lee Watson Kitchen Physically Abused:   . Sexually Abused:     History reviewed. No pertinent family history.   Review of Systems  Constitutional: Negative.  Negative for chills and fever.  HENT: Negative.  Negative for congestion and sore throat.   Respiratory: Negative.  Negative for cough and shortness of breath.   Cardiovascular: Negative.  Negative for chest pain and palpitations.  Gastrointestinal: Negative.  Negative for abdominal pain, diarrhea, nausea and vomiting.  Genitourinary: Negative.  Negative for dysuria and hematuria.  Musculoskeletal: Negative.  Negative for back pain, myalgias and neck pain.  Skin: Negative.  Negative for rash.  Neurological: Negative.  Negative for dizziness and headaches.  All other systems reviewed and are negative.  Vitals:   07/28/19 1529  BP: 131/87  Pulse: 67  Resp: 16  Temp: 98.3 F (36.8 C)  SpO2: 98%     Physical Exam Vitals reviewed.  Constitutional:      Appearance: Normal  appearance.  HENT:     Head: Normocephalic.  Eyes:     Extraocular Movements: Extraocular movements intact.     Pupils: Pupils are equal, round, and reactive to light.  Cardiovascular:     Rate and Rhythm: Normal rate and regular rhythm.     Pulses: Normal pulses.     Heart sounds: Normal heart sounds.  Pulmonary:     Effort: Pulmonary effort is normal.     Breath sounds: Normal breath sounds.  Musculoskeletal:        General: Normal range of motion.     Cervical back: Normal range of motion and neck supple.  Skin:    General: Skin is warm and dry.     Capillary Refill: Capillary refill takes less than 2 seconds.  Neurological:     General: No focal deficit present.     Mental Status: He is alert and oriented to person, place, and time.  Psychiatric:        Mood and Affect: Mood normal.        Behavior: Behavior normal.    A total of 30 minutes was spent with the patient, greater than 50% of which was in counseling/coordination of care regarding hypertension and smoking, cardiovascular risks associated with these conditions, review of most recent blood work results, review of all medications and side effects, review of most recent office visit notes, diet and nutrition, importance of physical exercise, prognosis and need for follow-up in 3 months.   ASSESSMENT & PLAN: Clinically stable.  Continue Hyzaar 50-12.5 mg. Chantix working well.  We will continue. Follow-up in 3 months.  Lee Watson was seen today for hypertension.  Diagnoses and all orders for this visit:  Essential hypertension  Current smoker -     varenicline (CHANTIX CONTINUING MONTH PAK) 1 MG tablet; Take 1 tablet (1 mg total) by mouth 2 (two) times daily.    Patient Instructions       If you have lab work done today you will be contacted with your lab results within the next 2 weeks.  If you have not heard from [place] then please contact us. The fastest way to get your results is to register for My  Chart.   IF you received an x-ray today, you will receive an invoice from [facility]. Please contact [facility] at [phone] with questions or concerns regarding your invoice.   IF you received labwork today, you will receive an invoice from [place]. Please contact [facility] at [phone] with questions or concerns regarding your invoice.   Our billing staff will not be able to assist you with questions regarding bills from these companies.  You will be contacted with the lab results as soon as they are available. The fastest way  to get your results is to activate your My Chart account. Instructions are located on the last page of this paperwork. If you have not heard from [place] regarding the results in 2 weeks, please contact this office.     Plan de alimentacin DASH DASH Eating Plan DASH es la sigla en ingls de "Enfoques Alimentarios para Detener la Hipertensin" (Dietary Approaches to Stop Hypertension). El plan de alimentacin DASH ha demostrado bajar la presin arterial elevada (hipertensin). Tambin puede reducir UnitedHealth de diabetes tipo 2, enfermedad cardaca y accidente cerebrovascular. Este plan tambin puede ayudar a Horticulturist, commercial. Consejos para seguir este plan  Pautas generales  Evite ingerir ms de 2,300 mg (miligramos) de sal (sodio) por da. Si tiene hipertensin, es posible que necesite reducir la ingesta de sodio a 1,500 mg por da.  Limite el consumo de alcohol a no ms de 93medida por da si es mujer y no est Arnold, y 44medidas por da si es hombre. Una medida equivale a 12oz (319ml) de cerveza, 5oz (169ml) de vino o 1oz (5ml) de bebidas alcohlicas de alta graduacin.  Trabaje con su mdico para mantener un peso saludable o perder [employer]. Pregntele cul es el peso recomendado para usted.  Realice al menos 30 minutos de ejercicio que haga que se acelere su corazn (ejercicio Arboriculturist) la [employer] de la [place]. Estas  actividades pueden incluir caminar, nadar o andar en bicicleta.  Trabaje con su mdico o especialista en alimentacin y nutricin (nutricionista) para ajustar su plan alimentario a sus necesidades calricas personales. Lectura de las etiquetas de los alimentos   Verifique en las etiquetas de los alimentos, la cantidad de sodio por porcin. Elija alimentos con menos del 5 por ciento del valor diario de sodio. Generalmente, los alimentos con menos de 300 mg de sodio por porcin se encuadran dentro de este plan alimentario.  Para encontrar cereales integrales, busque la palabra "integral" como primera palabra en la lista de ingredientes. De compras  Compre productos en los que en su etiqueta diga: "bajo contenido de sodio" o "sin agregado de sal".  Compre alimentos frescos. Evite los alimentos enlatados y comidas precocidas o congeladas. Coccin  Evite agregar sal cuando cocine. Use hierbas o aderezos sin sal, en lugar de sal de mesa o sal marina. Consulte al mdico o farmacutico antes de usar sustitutos de la sal.  No fra los alimentos. A la hora de cocinar los alimentos opte por hornearlos, hervirlos, grillarlos y asarlos a Administrator, arts.  Cocine con aceites cardiosaludables, como oliva, canola, soja o girasol. Planificacin de las comidas  Consuma una dieta equilibrada, que incluya lo siguiente: ? 5o ms porciones de frutas y Set designer. Trate de que la mitad del plato de cada comida sean frutas y verduras. ? Hasta 6 u 8 porciones de cereales integrales por da. ? Menos de 6 onzas de carne, aves o pescado Games developer. Una porcin de 3 onzas de carne tiene casi el mismo tamao que un mazo de cartas. Un huevo equivale a 1 onza. ? Dos porciones de productos lcteos descremados por Training and development officer. ? Una porcin de frutos secos, semillas o frijoles 5 veces por semana. ? Grasas cardiosaludables. Las grasas saludables llamadas cidos grasos omega-3 se encuentran en alimentos como semillas de  lino y pescados de agua fra, como por ejemplo, sardinas, salmn y caballa.  Limite la cantidad que ingiere de los siguientes alimentos: ? Alimentos enlatados o envasados. ? Alimentos con alto contenido de grasa trans, como alimentos  fritos. ? Alimentos con alto contenido de grasa saturada, como carne con grasa. ? Dulces, postres, bebidas azucaradas y otros alimentos con azcar agregada. ? Productos lcteos enteros.  No le agregue sal a los alimentos antes de probarlos.  Trate de comer al menos 2 comidas vegetarianas por semana.  Consuma ms comida casera y menos de restaurante, de bufs y comida rpida.  Cuando coma en un restaurante, pida que preparen su comida con menos sal o, en lo posible, sin nada de sal. Qu alimentos se recomiendan? Los alimentos enumerados a continuacin no constituyen Furniture conservator/restorer. Hable con el nutricionista sobre las mejores opciones alimenticias para usted. Cereales Pan de salvado o integral. Pasta de salvado o integral. Arroz integral. Avena. Quinua. Trigo burgol. Cereales integrales y con bajo contenido de sodio. Pan pita. Galletitas de [place] con bajo contenido de [place] y [place]. Tortillas de [place] integral. Verduras Verduras frescas o congeladas (crudas, al vapor, asadas o grilladas). Jugos de tomate y verduras con bajo contenido de sodio o reducidos en sodio. Salsa y pasta de tomate con bajo contenido de sodio o reducidas en sodio. Verduras enlatadas con bajo contenido de sodio o reducidas en sodio. Frutas Todas las frutas frescas, congeladas o disecadas. Frutas enlatadas en jugo natural (sin agregado de azcar). Carne y otros alimentos proteicos Pollo o pavo sin piel. Carne de pollo o de [place]. Cerdo desgrasado. Pescado y [employer]. Claras de huevo. Porotos, guisantes o lentejas secos. Frutos secos, mantequilla de frutos secos y semillas sin sal. Frijoles enlatados sin sal. Cortes de carne vacuna magra, desgrasada. Embutidos magros, con bajo  contenido de [place]. Lcteos Leche descremada (1%) o descremada. Quesos sin grasa, con bajo contenido de grasa o descremados. Queso blanco o ricota sin grasa, con bajo contenido de [place]. Yogur semidescremado o descremado. Queso con bajo contenido de [place] y [place]. Grasas y [employer] untables que no contengan grasas trans. Aceite vegetal. Lubertha Basque y aderezos para ensaladas livianos o con bajo contenido de grasas (reducidos en sodio). Aceite de canola, crtamo, oliva, soja y [place]. [place]. Condimentos y otros alimentos Hierbas. Especias. Mezclas de condimentos sin sal. Palomitas de maz y pretzels sin sal. Dulces con bajo contenido de grasas. Qu alimentos no se recomiendan? Los alimentos enumerados a continuacin no constituyen Furniture conservator/restorer. Hable con el nutricionista sobre las mejores opciones alimenticias para usted. Cereales Productos de panificacin hechos con grasa, como medialunas, magdalenas y algunos panes. Comidas con arroz o pasta seca listas para usar. Verduras Verduras con crema o fritas. Verduras en [place]. Verduras enlatadas regulares (que no sean con bajo contenido de sodio o reducidas en sodio). Pasta y salsa de tomates enlatadas regulares (que no sean con bajo contenido de sodio o reducidas en sodio). Jugos de tomate y verduras regulares (que no sean con bajo contenido de sodio o reducidos en sodio). Pepinillos. Aceitunas. Lambert Mody Fruta enlatada en almbar liviano o espeso. Frutas cocidas en aceite. Frutas con salsa de crema o [place]. Carne y otros alimentos proteicos Cortes de carne con grasa. Costillas. Carne frita. [place]. Salchichas. Mortadela y otras carnes procesadas. Salame. Panceta. Perros calientes (hotdogs). [place]. Frutos secos y semillas con sal. Frijoles enlatados con agregado de sal. Pescado enlatado o ahumado. Huevos enteros o yemas. Pollo o pavo con piel. Lcteos Leche entera o al 2%, crema y mitad leche y mitad crema. Queso  crema entero o con toda su grasa. Yogur entero o endulzado. Quesos con toda su grasa. Sustitutos de cremas no lcteas. Coberturas batidas. Aileen Fass  para untar y quesos procesados. Grasas y [employer]. Margarina en barra. [place]. Materia grasa. Mantequilla clarificada. Grasa de panceta. Aceites tropicales como aceite de coco, palmiste o palma. Condimentos y otros alimentos Palomitas de maz y pretzels con sal. Sal de cebolla, sal de ajo, sal condimentada, sal de mesa y sal marina. Salsa [place]. Salsa trtara. Salsa barbacoa. Salsa teriyaki. Salsa de soja, incluso la que tiene contenido reducido de [place]. Salsa de carne. Salsas en lata y envasadas. Salsa de pescado. Salsa de [place]. Salsa rosada. Rbano picante envasado. [place]. [place]. Saborizantes y tiernizantes para carne. Caldo en cubitos. Salsa picante y salsa tabasco. Escabeches envasados o ya preparados. Aderezos para tacos prefabricados o envasados. Salsas. Aderezos comunes para ensalada. Dnde encontrar ms informacin:  [place], los Pulmones y Herbalist (National Heart, Lung, and [place]): https://wilson-eaton.com/  Asociacin Estadounidense del Corazn (American Heart Association): www.heart.org Resumen  El plan de alimentacin DASH ha demostrado bajar la presin arterial elevada (hipertensin). Tambin puede reducir UnitedHealth de diabetes tipo 2, enfermedad cardaca y accidente cerebrovascular.  Con el plan de alimentacin DASH, deber limitar el consumo de sal (sodio) a 2,300 mg por da. Si tiene hipertensin, es posible que necesite reducir la ingesta de sodio a 1,500 mg por da.  Cuando siga el plan de alimentacin DASH, trate de comer ms frutas frescas y verduras, cereales integrales, carnes magras, lcteos descremados y grasas cardiosaludables.  Trabaje con su mdico o especialista en alimentacin y nutricin (nutricionista) para ajustar su plan alimentario a sus necesidades calricas  personales. Esta informacin no tiene Marine scientist el consejo del mdico. Asegrese de hacerle al mdico cualquier pregunta que tenga. Document Revised: 06/16/2016 Document Reviewed: 06/16/2016 Elsevier Patient Education  2020 Elsevier Inc.      Agustina Caroli, MD [facility]

## 2019-09-09 ENCOUNTER — Telehealth: Payer: Self-pay | Admitting: Emergency Medicine

## 2019-09-09 ENCOUNTER — Other Ambulatory Visit: Payer: Self-pay

## 2019-09-09 DIAGNOSIS — I10 Essential (primary) hypertension: Secondary | ICD-10-CM

## 2019-09-09 MED ORDER — LOSARTAN POTASSIUM-HCTZ 50-12.5 MG PO TABS: 1.0000 | ORAL_TABLET | Freq: Every day | ORAL | 3 refills | Status: DC

## 2019-09-09 NOTE — Telephone Encounter (Signed)
done

## 2019-09-09 NOTE — Telephone Encounter (Signed)
Patient is going out of country leaving the 7th and coming back the 22nd and he is requesting a refill at least 15 pills    What is the name of the medication? losartan-hydrochlorothiazide (HYZAAR) 50-12.5 MG tablet    Have you contacted your pharmacy to request a refill? n  Which pharmacy would you like this sent to? [facility] [place], [address]  [place], [place] [address]  Phone:  [phone] Fax:  [phone]    Patient notified that their request is being sent to the clinical staff for review and that they should receive a call once it is complete. If they do not receive a call within 72 hours they can check with their pharmacy or our office.

## 2019-10-31 ENCOUNTER — Ambulatory Visit: Payer: BC Managed Care – PPO | Admitting: Emergency Medicine

## 2019-11-09 ENCOUNTER — Other Ambulatory Visit: Payer: Self-pay

## 2019-11-09 DIAGNOSIS — Z20822 Contact with and (suspected) exposure to covid-19: Secondary | ICD-10-CM | POA: Diagnosis not present

## 2019-11-09 DIAGNOSIS — U071 COVID-19: Secondary | ICD-10-CM

## 2019-11-09 HISTORY — DX: COVID-19: U07.1

## 2019-11-10 ENCOUNTER — Other Ambulatory Visit: Payer: Self-pay

## 2019-11-10 ENCOUNTER — Telehealth (INDEPENDENT_AMBULATORY_CARE_PROVIDER_SITE_OTHER): Payer: BC Managed Care – PPO | Admitting: Emergency Medicine

## 2019-11-10 ENCOUNTER — Encounter: Payer: Self-pay | Admitting: Emergency Medicine

## 2019-11-10 VITALS — Temp 98.9°F | Ht 65.0 in | Wt 154.0 lb

## 2019-11-10 DIAGNOSIS — M791 Myalgia, unspecified site: Secondary | ICD-10-CM | POA: Diagnosis not present

## 2019-11-10 DIAGNOSIS — U071 COVID-19: Secondary | ICD-10-CM | POA: Diagnosis not present

## 2019-11-10 DIAGNOSIS — R6889 Other general symptoms and signs: Secondary | ICD-10-CM | POA: Diagnosis not present

## 2019-11-10 LAB — NOVEL CORONAVIRUS, NAA: SARS-CoV-2, NAA: DETECTED — AB

## 2019-11-10 MED ORDER — HYDROCODONE-ACETAMINOPHEN 5-325 MG PO TABS: 1.0000 | ORAL_TABLET | Freq: Four times a day (QID) | ORAL | 0 refills | Status: DC | PRN

## 2019-11-10 NOTE — Patient Instructions (Signed)
° ° ° °  If you have lab work done today you will be contacted with your lab results within the next 2 weeks.  If you have not heard from us then please contact us. The fastest way to get your results is to register for My Chart. ° ° °IF you received an x-ray today, you will receive an invoice from [facility]. Please contact [facility] at [phone] with questions or concerns regarding your invoice.  ° °IF you received labwork today, you will receive an invoice from [facility]. Please contact [facility] at [phone] with questions or concerns regarding your invoice.  ° °Our billing staff will not be able to assist you with questions regarding bills from these companies. ° °You will be contacted with the lab results as soon as they are available. The fastest way to get your results is to activate your My Chart account. Instructions are located on the last page of this paperwork. If you have not heard from us regarding the results in 2 weeks, please contact this office. °  ° ° ° °

## 2019-11-10 NOTE — Progress Notes (Signed)
Telemedicine Encounter- SOAP NOTE Established Patient MyChart video encounter This video telephone encounter was conducted with the patient's (or proxy's) verbal consent via video audio telecommunications: yes/no: Yes Patient was instructed to have this encounter in a suitably private space; and to only have persons present to whom they give permission to participate. In addition, patient identity was confirmed by use of name plus two identifiers (DOB and address).  I discussed the limitations, risks, security and privacy concerns of performing an evaluation and management service by telephone and the availability of in person appointments. I also discussed with the patient that there may be a patient responsible charge related to this service. The patient expressed understanding and agreed to proceed.  I spent a total of TIME; 0 MIN TO 60 MIN: 20 minutes talking with the patient or their proxy.  Chief Complaint  Patient presents with  . Covid Exposure    covid test came back positive on 11/08/19. still coughing, sore throat, body ache. fever for the past few days. highest fever was 101.0, headache. Patient would like medication for the fever and the pain.    Subjective   Lee Watson is a 49 y.o. male established patient. Telephone visit today complaining of flulike symptoms that started 4 days ago. Tested positive for Covid. Denies difficulty breathing or chest pain. Has fever, chills, body aches, headaches, and congestion. Not vaccinated against Covid. Family members also sick with Covid. No other significant symptoms. No other complaints or medical concerns today.  HPI   Patient Active Problem List   Diagnosis Date Noted  . Essential hypertension 06/30/2019  . Current smoker 06/30/2019    History reviewed. No pertinent past medical history.  Current Outpatient Medications  Medication Sig Dispense Refill  . aspirin 325 MG tablet Take 325 mg by mouth as needed.    Marland Kitchen  losartan-hydrochlorothiazide (HYZAAR) 50-12.5 MG tablet Take 1 tablet by mouth daily. 90 tablet 3   No current facility-administered medications for this visit.    No Known Allergies  Social History   Socioeconomic History  . Marital status: Married    Spouse name: Not on file  . Number of children: Not on file  . Years of education: Not on file  . Highest education level: Not on file  Occupational History  . Not on file  Tobacco Use  . Smoking status: Current Every Day Smoker    Years: 25.00    Types: Cigarettes  . Smokeless tobacco: Never Used  . Tobacco comment: 8-9 a day cigarettes  Substance and Sexual Activity  . Alcohol use: Yes    Comment: cans of beers on weekends  . Drug use: Never  . Sexual activity: Not on file  Other Topics Concern  . Not on file  Social History Narrative  . Not on file   Social Determinants of Health   Financial Resource Strain:   . Difficulty of Paying Living Expenses: Not on file  Food Insecurity:   . Worried About Charity fundraiser in the Last Year: Not on file  . Ran Out of Food in the Last Year: Not on file  Transportation Needs:   . Lack of Transportation (Medical): Not on file  . Lack of Transportation (Non-Medical): Not on file  Physical Activity:   . Days of Exercise per Week: Not on file  . Minutes of Exercise per Session: Not on file  Stress:   . Feeling of Stress : Not on file  Social Connections:   .  Frequency of Communication with Friends and Family: Not on file  . Frequency of Social Gatherings with Friends and Family: Not on file  . Attends Religious Services: Not on file  . Active Member of Clubs or Organizations: Not on file  . Attends Archivist Meetings: Not on file  . Marital Status: Not on file  Intimate Partner Violence:   . Fear of Current or Ex-Partner: Not on file  . Emotionally Abused: Not on file  . Physically Abused: Not on file  . Sexually Abused: Not on file    Review of Systems    Constitutional: Positive for chills and fever.  HENT: Positive for congestion and sore throat.   Respiratory: Positive for cough. Negative for shortness of breath.   Cardiovascular: Negative for chest pain and palpitations.  Gastrointestinal: Negative for abdominal pain, diarrhea, nausea and vomiting.  Musculoskeletal: Positive for myalgias.  Skin: Negative.  Negative for rash.  Neurological: Positive for headaches.    Objective  Alert and oriented x3 in no apparent respiratory distress Vitals as reported by the patient: Today's Vitals   11/10/19 1409  Temp: 98.9 F (37.2 C)  TempSrc: Oral  Weight: 154 lb (69.9 kg)  Height: 5\' 5"  (1.651 m)    There are no diagnoses linked to this encounter. [name] was seen today for covid exposure.  Diagnoses and all orders for this visit:  Flu-like symptoms  COVID-19 virus infection  Myalgia -     HYDROcodone-acetaminophen (NORCO) 5-325 MG tablet; Take 1 tablet by mouth every 6 (six) hours as needed for moderate pain or severe pain.   Clinically stable. No red flag signs or symptoms. ED precautions given. Advised to contact the office if no better or worse during the next several days.  I discussed the assessment and treatment plan with the patient. The patient was provided an opportunity to ask questions and all were answered. The patient agreed with the plan and demonstrated an understanding of the instructions.   The patient was advised to call back or seek an in-person evaluation if the symptoms worsen or if the condition fails to improve as anticipated.  I provided 20 minutes of non-face-to-face time during this encounter.  Horald Pollen, MD  Primary Care at [facility]

## 2019-11-11 ENCOUNTER — Telehealth: Payer: Self-pay | Admitting: Infectious Diseases

## 2019-11-11 ENCOUNTER — Other Ambulatory Visit: Payer: Self-pay | Admitting: Infectious Diseases

## 2019-11-11 DIAGNOSIS — U071 COVID-19: Secondary | ICD-10-CM

## 2019-11-11 DIAGNOSIS — I10 Essential (primary) hypertension: Secondary | ICD-10-CM

## 2019-11-11 NOTE — Telephone Encounter (Signed)
Symptoms started Tuesday 11/08/2019 He was tested and found to be positive   His wife came up on our list  He will bring

## 2019-11-11 NOTE — Progress Notes (Signed)
I connected by phone with Lee Watson on 11/11/2019 at 7:46 PM to discuss the potential use of a new treatment for mild to moderate COVID-19 viral infection in non-hospitalized patients.  This patient is a 49 y.o. male that meets the FDA criteria for Emergency Use Authorization of COVID monoclonal antibody casirivimab/imdevimab.  Has a (+) direct SARS-CoV-2 viral test result  Has mild or moderate COVID-19   Is NOT hospitalized due to COVID-19  Is within 10 days of symptom onset  Has at least one of the high risk factor(s) for progression to severe COVID-19 and/or hospitalization as defined in EUA.  Specific high risk criteria : Cardiovascular disease or hypertension   I have spoken and communicated the following to the patient or parent/caregiver regarding COVID monoclonal antibody treatment:  1. FDA has authorized the emergency use for the treatment of mild to moderate COVID-19 in adults and pediatric patients with positive results of direct SARS-CoV-2 viral testing who are 66 years of age and older weighing at least 40 kg, and who are at high risk for progressing to severe COVID-19 and/or hospitalization.  2. The significant known and potential risks and benefits of COVID monoclonal antibody, and the extent to which such potential risks and benefits are unknown.  3. Information on available alternative treatments and the risks and benefits of those alternatives, including clinical trials.  4. Patients treated with COVID monoclonal antibody should continue to self-isolate and use infection control measures (e.g., wear mask, isolate, social distance, avoid sharing personal items, clean and disinfect "high touch" surfaces, and frequent handwashing) according to CDC guidelines.   5. The patient or parent/caregiver has the option to accept or refuse COVID monoclonal antibody treatment.  After reviewing this information with the patient, The patient agreed to proceed with receiving  casirivimab\imdevimab infusion and will be provided a copy of the Fact sheet prior to receiving the infusion. [name] 11/11/2019 7:46 PM

## 2019-11-11 NOTE — Telephone Encounter (Signed)
Called to Discuss with patient about Covid symptoms and the use of the monoclonal antibody infusion for those with mild to moderate Covid symptoms and at a high risk of hospitalization.     Pt appears to qualify for this infusion due to co-morbid conditions and/or a member of an at-risk group in accordance with the FDA Emergency Use Authorization.    Meets criteria with HTN Sx started 8/31 He will bring positive test result with him   The following was discussed.   If you have been tested outside of a [facility] - you MUST bring a copy of your positive test with you the morning of your appointment. You may take a photo of this and upload to your MyChart portal or have the testing facility fax the result to [phone]    The address for the infusion clinic site is:  --GPS address is [place] - the parking is located near [employer] building where you will see  COVID19 Infusion feather banner marking the entrance to parking.   (see photos below)            --Enter into the 2nd entrance where the "wave, flag banner" is at the road. Turn into this 2nd entrance and immediately turn left to park in 1 of the 5 parking spots.   --Please stay in your car and call the desk for assistance inside [phone].   --Average time in department is roughly 2 hours for [facility]ener[facility] treatment - this includes preparation of the medication, IV start and the required 1 hour monitoring after the infusion.    Should you develop worsening shortness of breath, chest pain or severe breathing problems please do not wait for this appointment and go to the Emergency room for evaluation and treatment. You will undergo another oxygen screen before your infusion to ensure this is the best treatment option for you. There is a chance that the best decision may be to send you to the Emergency Room for evaluation at the time of your appointment.   The day of your visit you should: Marland Kitchen Get plenty of  rest the night before and drink plenty of water . Eat a light meal/snack before coming and take your medications as prescribed  . Wear warm, comfortable clothes with a shirt that can roll-up over the elbow (will need IV start).  . Wear a mask  . Consider bringing some activity to help pass the time  Many commercial insurers are waiving bills related to [place] treatment however some have ranged from $300-640. We are starting to see some insurers send bills to patients later for the administration of the medication - we are learning more information but you may receive a bill after your appointment.  Please contact your insurance agent to discuss prior to your appointment if you would like further details about billing specific to your policy.    I hope this helps find you feeling better,  [name]

## 2019-11-12 ENCOUNTER — Ambulatory Visit (HOSPITAL_COMMUNITY)
Admission: RE | Admit: 2019-11-12 | Discharge: 2019-11-12 | Disposition: A | Discharge status: Home / Self Care | Payer: HRSA Program | Source: Ambulatory Visit | Attending: Pulmonary Disease | Admitting: Pulmonary Disease

## 2019-11-12 DIAGNOSIS — U071 COVID-19: Secondary | ICD-10-CM | POA: Insufficient documentation

## 2019-11-12 DIAGNOSIS — I10 Essential (primary) hypertension: Secondary | ICD-10-CM | POA: Insufficient documentation

## 2019-11-12 MED ORDER — EPINEPHRINE 0.3 MG/0.3ML IJ SOAJ: 0.3000 mg | Freq: Once | INTRAMUSCULAR | Status: DC | PRN

## 2019-11-12 MED ORDER — SODIUM CHLORIDE 0.9 % IV SOLN
1200.0000 mg | Freq: Once | INTRAVENOUS | Status: AC
  Administered 2019-11-12: 1200 mg via INTRAVENOUS
  Filled 2019-11-12: qty 10

## 2019-11-12 MED ORDER — SODIUM CHLORIDE 0.9 % IV SOLN: INTRAVENOUS | Status: DC | PRN

## 2019-11-12 MED ORDER — FAMOTIDINE IN NACL 20-0.9 MG/50ML-% IV SOLN: 20.0000 mg | Freq: Once | INTRAVENOUS | Status: DC | PRN

## 2019-11-12 MED ORDER — DIPHENHYDRAMINE HCL 50 MG/ML IJ SOLN: 50.0000 mg | Freq: Once | INTRAMUSCULAR | Status: DC | PRN

## 2019-11-12 MED ORDER — ALBUTEROL SULFATE HFA 108 (90 BASE) MCG/ACT IN AERS: 2.0000 | INHALATION_SPRAY | Freq: Once | RESPIRATORY_TRACT | Status: DC | PRN

## 2019-11-12 MED ORDER — METHYLPREDNISOLONE SODIUM SUCC 125 MG IJ SOLR: 125.0000 mg | Freq: Once | INTRAMUSCULAR | Status: DC | PRN

## 2019-11-12 NOTE — Progress Notes (Signed)
  Diagnosis: COVID-19  Physician: Asencion Noble, MD  Procedure: Covid Infusion Clinic Med: casirivimab\imdevimab infusion - Provided patient with casirivimab\imdevimab fact sheet for patients, parents and caregivers prior to infusion.  Complications: No immediate complications noted.  Discharge: Discharged home   Lee Watson 11/12/2019

## 2019-11-12 NOTE — Discharge Instructions (Signed)
10 Things You Can Do to Manage Your COVID-19 Symptoms at Home If you have possible or confirmed COVID-19: 1. Stay home from work and school. And stay away from other public places. If you must go out, avoid using any kind of public transportation, ridesharing, or taxis. 2. Monitor your symptoms carefully. If your symptoms get worse, call your healthcare provider immediately. 3. Get rest and stay hydrated. 4. If you have a medical appointment, call the healthcare provider ahead of time and tell them that you have or may have COVID-19. 5. For medical emergencies, call 911 and notify the dispatch personnel that you have or may have COVID-19. 6. Cover your cough and sneezes with a tissue or use the inside of your elbow. 7. Wash your hands often with soap and water for at least 20 seconds or clean your hands with an alcohol-based hand sanitizer that contains at least 60% alcohol. 8. As much as possible, stay in a specific room and away from other people in your home. Also, you should use a separate bathroom, if available. If you need to be around other people in or outside of the home, wear a mask. 9. Avoid sharing personal items with other people in your household, like dishes, towels, and bedding. 10. Clean all surfaces that are touched often, like counters, tabletops, and doorknobs. Use household cleaning sprays or wipes according to the label instructions. [email or web address] 09/08/2018 This information is not intended to replace advice given to you by your health care provider. Make sure you discuss any questions you have with your health care provider. Document Revised: 02/10/2019 Document Reviewed: 02/10/2019 Elsevier Patient Education  [place]  10 Things You Can Do to Manage Your COVID-19 Symptoms at Home If you have possible or confirmed COVID-19: 11. Stay home from work and school. And stay away from other public places. If you must go out, avoid using any kind of public  transportation, ridesharing, or taxis. 12. Monitor your symptoms carefully. If your symptoms get worse, call your healthcare provider immediately. 13. Get rest and stay hydrated. 14. If you have a medical appointment, call the healthcare provider ahead of time and tell them that you have or may have COVID-19. 15. For medical emergencies, call 911 and notify the dispatch personnel that you have or may have COVID-19. 16. Cover your cough and sneezes with a tissue or use the inside of your elbow. 42. Wash your hands often with soap and water for at least 20 seconds or clean your hands with an alcohol-based hand sanitizer that contains at least 60% alcohol. 18. As much as possible, stay in a specific room and away from other people in your home. Also, you should use a separate bathroom, if available. If you need to be around other people in or outside of the home, wear a mask. 19. Avoid sharing personal items with other people in your household, like dishes, towels, and bedding. 20. Clean all surfaces that are touched often, like counters, tabletops, and doorknobs. Use household cleaning sprays or wipes according to the label instructions. [email or web address] 09/08/2018 This information is not intended to replace advice given to you by your health care provider. Make sure you discuss any questions you have with your health care provider. Document Revised: 02/10/2019 Document Reviewed: 02/10/2019 Elsevier Patient Education  [place]. What types of side effects do monoclonal antibody drugs cause?  Common side effects  In general, the more common side effects caused by monoclonal antibody drugs include: .  Allergic reactions, such as hives or itching . Flu-like signs and symptoms, including chills, fatigue, fever, and muscle aches and pains . Nausea, vomiting . Diarrhea . Skin rashes . Low blood pressure   The CDC is recommending patients who receive monoclonal antibody treatments wait  at least 90 days before being vaccinated.  Currently, there are no data on the safety and efficacy of mRNA COVID-19 vaccines in persons who received monoclonal antibodies or convalescent plasma as part of COVID-19 treatment. Based on the estimated half-life of such therapies as well as evidence suggesting that reinfection is uncommon in the 90 days after initial infection, vaccination should be deferred for at least 90 days, as a precautionary measure until additional information becomes available, to avoid interference of the antibody treatment with vaccine-induced immune responses.

## 2019-11-17 ENCOUNTER — Telehealth: Payer: Self-pay

## 2019-11-17 NOTE — Telephone Encounter (Signed)
Pt. Called reporting hydrocodone-acetaminophen was not at the pharmacy and requested the script be resent

## 2019-11-18 NOTE — Telephone Encounter (Signed)
Pt called stating the pharmacy did not receive Hydro/Apap rx called and verified they do not have this.

## 2019-11-19 ENCOUNTER — Other Ambulatory Visit: Payer: Self-pay | Admitting: Emergency Medicine

## 2019-11-19 DIAGNOSIS — M791 Myalgia, unspecified site: Secondary | ICD-10-CM

## 2019-11-19 MED ORDER — HYDROCODONE-ACETAMINOPHEN 5-325 MG PO TABS: 1.0000 | ORAL_TABLET | Freq: Four times a day (QID) | ORAL | 0 refills | Status: DC | PRN

## 2019-11-19 NOTE — Telephone Encounter (Signed)
New prescription sent to pharmacy of record.  Thanks.

## 2019-11-21 ENCOUNTER — Ambulatory Visit: Payer: BC Managed Care – PPO | Admitting: Emergency Medicine

## 2019-12-05 ENCOUNTER — Ambulatory Visit: Payer: BC Managed Care – PPO | Admitting: Emergency Medicine

## 2019-12-28 ENCOUNTER — Other Ambulatory Visit: Payer: Self-pay

## 2019-12-28 ENCOUNTER — Ambulatory Visit (INDEPENDENT_AMBULATORY_CARE_PROVIDER_SITE_OTHER): Payer: BC Managed Care – PPO | Admitting: Emergency Medicine

## 2019-12-28 ENCOUNTER — Encounter: Payer: Self-pay | Admitting: Emergency Medicine

## 2019-12-28 VITALS — BP 143/83 | HR 76 | Temp 98.4°F | Resp 16 | Ht 65.0 in | Wt 152.0 lb

## 2019-12-28 DIAGNOSIS — I10 Essential (primary) hypertension: Secondary | ICD-10-CM | POA: Diagnosis not present

## 2019-12-28 NOTE — Patient Instructions (Addendum)
If you have lab work done today you will be contacted with your lab results within the next 2 weeks.  If you have not heard from [place] then please contact us. The fastest way to get your results is to register for My Chart.   IF you received an x-ray today, you will receive an invoice from [facility]. Please contact [facility] at [phone] with questions or concerns regarding your invoice.   IF you received labwork today, you will receive an invoice from [place]. Please contact [facility] at [phone] with questions or concerns regarding your invoice.   Our billing staff will not be able to assist you with questions regarding bills from these companies.  You will be contacted with the lab results as soon as they are available. The fastest way to get your results is to activate your My Chart account. Instructions are located on the last page of this paperwork. If you have not heard from [place] regarding the results in 2 weeks, please contact this office.     Mantenimiento de Teacher, English as a foreign language, Male Adoptar un estilo de vida saludable y recibir atencin preventiva son importantes para promover la salud y Musician. Consulte al mdico sobre:  El esquema adecuado para hacerse pruebas y exmenes peridicos.  Cosas que puede hacer por su cuenta para prevenir enfermedades y [place] sano. Qu debo saber sobre la dieta, el peso y el ejercicio? Consuma una dieta saludable   Consuma una dieta que incluya muchas verduras, frutas, productos lcteos con bajo contenido de [place] y Advertising account planner.  No consuma muchos alimentos ricos en grasas slidas, azcares agregados o sodio. Mantenga un peso saludable El ndice de masa muscular [facility]) es una medida que puede utilizarse para identificar posibles problemas de [place]. Proporciona una estimacin de la grasa corporal basndose en el peso y la altura. Su mdico puede ayudarle a Radiation protection practitioner [place] y a  Scientist, forensic o Theatre manager un peso saludable. Haga ejercicio con regularidad Haga ejercicio con regularidad. Esta es una de las prcticas ms importantes que puede hacer por su salud. La mayora de los adultos deben seguir estas pautas:  Optometrist, al menos, 140minutos de actividad fsica por semana. El ejercicio debe aumentar la frecuencia cardaca y Nature conservation officer transpirar (ejercicio de intensidad moderada).  Hacer ejercicios de fortalecimiento por lo [employer] por semana. Agregue esto a su plan de ejercicio de intensidad moderada.  Pasar menos tiempo sentados. Incluso la actividad fsica ligera puede ser beneficiosa. Controle sus niveles de colesterol y lpidos en la sangre Comience a realizarse anlisis de lpidos y Research officer, trade union en la sangre a los 20aos y luego reptalos cada 5aos. Es posible que Automotive engineer los niveles de colesterol con mayor frecuencia si:  Sus niveles de lpidos y colesterol son altos.  Es mayor de 40aos.  Presenta un alto riesgo de padecer enfermedades cardacas. Qu debo saber sobre las pruebas de deteccin del cncer? Muchos tipos de cncer pueden detectarse de manera temprana y, a menudo, pueden prevenirse. Segn su historia clnica y sus antecedentes familiares, es posible que deba realizarse pruebas de deteccin del cncer en diferentes edades. Esto puede incluir pruebas de deteccin de lo siguiente:  Surveyor, minerals.  Cncer de prstata.  Cncer de piel.  Cncer de pulmn. Qu debo saber sobre la enfermedad cardaca, la diabetes y la hipertensin arterial? Presin arterial y enfermedad cardaca  La hipertensin arterial causa enfermedades cardacas y [place] el riesgo de accidente cerebrovascular. Es ms probable que  esto se manifieste en las personas que tienen lecturas de presin arterial alta, tienen ascendencia africana o tienen sobrepeso.  Hable con el mdico sobre sus valores de presin arterial deseados.  Hgase controlar la presin  arterial: ? Cada 3 a 5 aos si tiene entre 18 y 71 aos. ? Todos los aos si es mayor de [place].  Si tiene entre 32 y 17 aos y es fumador o Insurance account manager, pregntele al mdico si debe realizarse una prueba de deteccin de aneurisma artico abdominal (AAA) por nica vez. Diabetes Realcese exmenes de deteccin de la diabetes con regularidad. Este anlisis revisa el nivel de azcar en la sangre en [place]. Hgase las pruebas de deteccin:  Cada tresaos despus de los 44aos de edad si tiene un peso normal y un bajo riesgo de padecer diabetes.  Con ms frecuencia y a partir de [place] edad inferior si tiene sobrepeso o un alto riesgo de padecer diabetes. Qu debo saber sobre la prevencin de infecciones? Hepatitis B Si tiene un riesgo ms alto de contraer hepatitis B, debe someterse a un examen de deteccin de este virus. Hable con el mdico para averiguar si tiene riesgo de contraer la infeccin por hepatitis B. Hepatitis C Se recomienda un anlisis de [place] para:  Todos los que nacieron entre 1947 y [phone].  Todas las personas que tengan un riesgo de haber contrado hepatitis C. Enfermedades de transmisin sexual (ETS)  Debe realizarse pruebas de deteccin de ITS todos los aos, incluidas la gonorrea y la clamidia, si: ? Es sexualmente activo y es menor de 24aos. ? Es mayor de 24aos, y Investment banker, operational informa que corre riesgo de tener este tipo de infecciones. ? La actividad sexual ha cambiado desde que le hicieron la ltima prueba de deteccin y tiene un riesgo mayor de Best boy clamidia o Radio broadcast assistant. Pregntele al mdico si usted tiene riesgo.  Pregntele al mdico si usted tiene un alto riesgo de Museum/gallery curator VIH. El mdico tambin puede recomendarle un medicamento recetado para ayudar a evitar la infeccin por el VIH. Si elige tomar medicamentos para prevenir el VIH, primero debe [employer] de deteccin del VIH. Luego debe hacerse anlisis cada 86meses mientras est tomando los  medicamentos. Siga estas instrucciones en su casa: Estilo de vida  No consuma ningn producto que contenga nicotina o tabaco, como cigarrillos, cigarrillos electrnicos y tabaco de Higher education careers adviser. Si necesita ayuda para dejar de fumar, consulte al mdico.  No consuma drogas.  No comparta agujas.  Solicite ayuda a su mdico si necesita apoyo o informacin para abandonar las drogas. Consumo de alcohol  No beba alcohol si el mdico se lo prohbe.  Si bebe alcohol: ? Limite la cantidad que consume de 0 a 2 medidas por da. ? Est atento a la cantidad de alcohol que hay en las bebidas que toma. En los [place], una medida equivale a una botella de cerveza de 12oz (377ml), un vaso de vino de 5oz (168ml) o un vaso de una bebida alcohlica de alta graduacin de 1oz (25ml). Instrucciones generales  Realcese los estudios de rutina de la salud, dentales y de Public librarian.  [place].  Infrmele a su mdico si: ? Se siente deprimido con frecuencia. ? Alguna vez ha sido vctima de [place] o no se siente seguro en su casa. Resumen  Adoptar un estilo de vida saludable y recibir atencin preventiva son importantes para promover la salud y Musician.  Siga las instrucciones del mdico acerca de  una dieta saludable, el ejercicio y la realizacin de pruebas o exmenes para Engineer, building services.  Siga las instrucciones del mdico con respecto al control del colesterol y la presin arterial. Esta informacin no tiene Marine scientist el consejo del mdico. Asegrese de hacerle al mdico cualquier pregunta que tenga. Document Revised: 03/17/2018 Document Reviewed: 03/17/2018 Elsevier Patient Education  [place].

## 2019-12-28 NOTE — Progress Notes (Signed)
Lee Watson 49 y.o.   Chief Complaint  Patient presents with  . Hypertension    follow up 3 month    HISTORY OF PRESENT ILLNESS: This is a 49 y.o. male with history of hypertension here for 29-month follow-up. Presently taking Hyzaar 50-12.5 mg daily.  Doing well. Has no complaints or medical concerns. Recently had Covid infection and had monoclonal antibody therapy.  Doing well.  Still has some mild residual fatigue.  HPI   Prior to Admission medications   Medication Sig Start Date End Date Taking? Authorizing Provider  aspirin 325 MG tablet Take 325 mg by mouth as needed.   Yes [provider]  losartan-hydrochlorothiazide (HYZAAR) 50-12.5 MG tablet Take 1 tablet by mouth daily. 09/09/19  Yes SagardiaInes Bloomer, MD    No Known Allergies  Patient Active Problem List   Diagnosis Date Noted  . Essential hypertension 06/30/2019  . Current smoker 06/30/2019    History reviewed. No pertinent past medical history.  History reviewed. No pertinent surgical history.  Social History   Socioeconomic History  . Marital status: Married    Spouse name: Not on file  . Number of children: Not on file  . Years of education: Not on file  . Highest education level: Not on file  Occupational History  . Not on file  Tobacco Use  . Smoking status: Current Every Day Smoker    Years: 25.00    Types: Cigarettes  . Smokeless tobacco: Never Used  . Tobacco comment: 8-9 a day cigarettes  Substance and Sexual Activity  . Alcohol use: Yes    Comment: cans of beers on weekends  . Drug use: Never  . Sexual activity: Not on file  Other Topics Concern  . Not on file  Social History Narrative  . Not on file   Social Determinants of Health   Financial Resource Strain:   . Difficulty of Paying Living Expenses: Not on file  Food Insecurity:   . Worried About Charity fundraiser in the Last Year: Not on file  . Ran Out of Food in the Last Year: Not on file   Transportation Needs:   . Lack of Transportation (Medical): Not on file  . Lack of Transportation (Non-Medical): Not on file  Physical Activity:   . Days of Exercise per Week: Not on file  . Minutes of Exercise per Session: Not on file  Stress:   . Feeling of Stress : Not on file  Social Connections:   . Frequency of Communication with Friends and Family: Not on file  . Frequency of Social Gatherings with Friends and Family: Not on file  . Attends Religious Services: Not on file  . Active Member of Clubs or Organizations: Not on file  . Attends Archivist Meetings: Not on file  . Marital Status: Not on file  Intimate Partner Violence:   . Fear of Current or Ex-Partner: Not on file  . Emotionally Abused: Not on file  . Physically Abused: Not on file  . Sexually Abused: Not on file    History reviewed. No pertinent family history.   Review of Systems  Constitutional: Negative.  Negative for fever.  HENT: Negative.  Negative for congestion and sore throat.   Eyes: Negative.   Respiratory: Negative.  Negative for cough and shortness of breath.   Cardiovascular: Negative.  Negative for chest pain and palpitations.  Gastrointestinal: Negative.  Negative for abdominal pain, diarrhea, nausea and vomiting.  Genitourinary: Negative.  Negative for dysuria and hematuria.  Musculoskeletal: Negative.  Negative for back pain, myalgias and neck pain.  Skin: Negative.  Negative for rash.  Neurological: Negative.  Negative for dizziness and headaches.  All other systems reviewed and are negative.  Today's Vitals   12/28/19 1623  BP: (!) 143/83  Pulse: 76  Resp: 16  Temp: 98.4 F (36.9 C)  TempSrc: Temporal  SpO2: 99%  Weight: 152 lb (68.9 kg)  Height: 5\' 5"  (1.651 m)   Body mass index is 25.29 kg/m.   Physical Exam Vitals reviewed.  Constitutional:      Appearance: Normal appearance.  HENT:     Head: Normocephalic.  Eyes:     Extraocular Movements: Extraocular  movements intact.     Conjunctiva/sclera: Conjunctivae normal.     Pupils: Pupils are equal, round, and reactive to light.  Cardiovascular:     Rate and Rhythm: Normal rate and regular rhythm.     Pulses: Normal pulses.     Heart sounds: Normal heart sounds.  Pulmonary:     Effort: Pulmonary effort is normal.     Breath sounds: Normal breath sounds.  Musculoskeletal:     Cervical back: Normal range of motion and neck supple.  Skin:    General: Skin is warm and dry.     Capillary Refill: Capillary refill takes less than 2 seconds.  Neurological:     General: No focal deficit present.     Mental Status: He is alert and oriented to person, place, and time.  Psychiatric:        Mood and Affect: Mood normal.        Behavior: Behavior normal.      ASSESSMENT & PLAN: Clinically stable.  No medical concerns identified during this visit.  Continue present medication.  No changes.  Follow-up in 6 months. Lee Watson was seen today for hypertension.  Diagnoses and all orders for this visit:  Essential hypertension    Patient Instructions       If you have lab work done today you will be contacted with your lab results within the next 2 weeks.  If you have not heard from [place] then please contact us. The fastest way to get your results is to register for My Chart.   IF you received an x-ray today, you will receive an invoice from [facility]. Please contact [facility] at [phone] with questions or concerns regarding your invoice.   IF you received labwork today, you will receive an invoice from [facility]. Please contact [facility] at [phone] with questions or concerns regarding your invoice.   Our billing staff will not be able to assist you with questions regarding bills from these companies.  You will be contacted with the lab results as soon as they are available. The fastest way to get your results is to activate your My Chart account. Instructions are  located on the last page of this paperwork. If you have not heard from [place] regarding the results in 2 weeks, please contact this office.     Mantenimiento de Teacher, English as a foreign language, Male Adoptar un estilo de vida saludable y recibir atencin preventiva son importantes para promover la salud y Musician. Consulte al mdico sobre:  El esquema adecuado para hacerse pruebas y exmenes peridicos.  Cosas que puede hacer por su cuenta para prevenir enfermedades y [place] sano. Qu debo saber sobre la dieta, el peso y el ejercicio? Consuma una dieta saludable   [employer]  dieta que incluya muchas verduras, frutas, productos lcteos con bajo contenido de [place] y Advertising account planner.  No consuma muchos alimentos ricos en grasas slidas, azcares agregados o sodio. Mantenga un peso saludable El ndice de masa muscular [facility]) es una medida que puede utilizarse para identificar posibles problemas de [place]. Proporciona una estimacin de la grasa corporal basndose en el peso y la altura. Su mdico puede ayudarle a Radiation protection practitioner [place] y a Scientist, forensic o Theatre manager un peso saludable. Haga ejercicio con regularidad Haga ejercicio con regularidad. Esta es una de las prcticas ms importantes que puede hacer por su salud. La mayora de los adultos deben seguir estas pautas:  Optometrist, al menos, 136minutos de actividad fsica por semana. El ejercicio debe aumentar la frecuencia cardaca y Nature conservation officer transpirar (ejercicio de intensidad moderada).  Hacer ejercicios de fortalecimiento por lo [employer] por semana. Agregue esto a su plan de ejercicio de intensidad moderada.  Pasar menos tiempo sentados. Incluso la actividad fsica ligera puede ser beneficiosa. Controle sus niveles de colesterol y lpidos en la sangre Comience a realizarse anlisis de lpidos y Research officer, trade union en la sangre a los 20aos y luego reptalos cada 5aos. Es posible que Automotive engineer los niveles de colesterol con  mayor frecuencia si:  Sus niveles de lpidos y colesterol son altos.  Es mayor de 40aos.  Presenta un alto riesgo de padecer enfermedades cardacas. Qu debo saber sobre las pruebas de deteccin del cncer? Muchos tipos de cncer pueden detectarse de manera temprana y, a menudo, pueden prevenirse. Segn su historia clnica y sus antecedentes familiares, es posible que deba realizarse pruebas de deteccin del cncer en diferentes edades. Esto puede incluir pruebas de deteccin de lo siguiente:  Surveyor, minerals.  Cncer de prstata.  Cncer de piel.  Cncer de pulmn. Qu debo saber sobre la enfermedad cardaca, la diabetes y la hipertensin arterial? Presin arterial y enfermedad cardaca  La hipertensin arterial causa enfermedades cardacas y [place] el riesgo de accidente cerebrovascular. Es ms probable que esto se manifieste en las personas que tienen lecturas de presin arterial alta, tienen ascendencia africana o tienen sobrepeso.  Hable con el mdico sobre sus valores de presin arterial deseados.  Hgase controlar la presin arterial: ? Cada 3 a 5 aos si tiene entre 18 y 82 aos. ? Todos los aos si es mayor de [place].  Si tiene entre 53 y 57 aos y es fumador o Insurance account manager, pregntele al mdico si debe realizarse una prueba de deteccin de aneurisma artico abdominal (AAA) por nica vez. Diabetes Realcese exmenes de deteccin de la diabetes con regularidad. Este anlisis revisa el nivel de azcar en la sangre en [place]. Hgase las pruebas de deteccin:  Cada tresaos despus de los 33aos de edad si tiene un peso normal y un bajo riesgo de padecer diabetes.  Con ms frecuencia y a partir de [place] edad inferior si tiene sobrepeso o un alto riesgo de padecer diabetes. Qu debo saber sobre la prevencin de infecciones? Hepatitis B Si tiene un riesgo ms alto de contraer hepatitis B, debe someterse a un examen de deteccin de este virus. Hable con el mdico para  averiguar si tiene riesgo de contraer la infeccin por hepatitis B. Hepatitis C Se recomienda un anlisis de [place] para:  Todos los que nacieron entre 1945 y [phone].  Todas las personas que tengan un riesgo de haber contrado hepatitis C. Enfermedades de transmisin sexual (ETS)  Debe realizarse pruebas de deteccin de ITS todos los aos, incluidas la [place] y  la clamidia, si: ? Es sexualmente [place] y es menor de [id number]. ? Es mayor de 24aos, y Investment banker, operational informa que corre riesgo de tener este tipo de infecciones. ? La actividad sexual ha cambiado desde que le hicieron la ltima prueba de deteccin y tiene un riesgo mayor de Best boy clamidia o Radio broadcast assistant. Pregntele al mdico si usted tiene riesgo.  Pregntele al mdico si usted tiene un alto riesgo de Museum/gallery curator VIH. El mdico tambin puede recomendarle un medicamento recetado para ayudar a evitar la infeccin por el VIH. Si elige tomar medicamentos para prevenir el VIH, primero debe [employer] de deteccin del VIH. Luego debe hacerse anlisis cada 1meses mientras est tomando los medicamentos. Siga estas instrucciones en su casa: Estilo de vida  No consuma ningn producto que contenga nicotina o tabaco, como cigarrillos, cigarrillos electrnicos y tabaco de Higher education careers adviser. Si necesita ayuda para dejar de fumar, consulte al mdico.  No consuma drogas.  No comparta agujas.  Solicite ayuda a su mdico si necesita apoyo o informacin para abandonar las drogas. Consumo de alcohol  No beba alcohol si el mdico se lo prohbe.  Si bebe alcohol: ? Limite la cantidad que consume de 0 a 2 medidas por da. ? Est atento a la cantidad de alcohol que hay en las bebidas que toma. En los [place], una medida equivale a una botella de cerveza de 12oz (333ml), un vaso de vino de 5oz (170ml) o un vaso de una bebida alcohlica de alta graduacin de 1oz (21ml). Instrucciones generales  Realcese los estudios de rutina de la salud,  dentales y de Public librarian.  [place].  Infrmele a su mdico si: ? Se siente deprimido con frecuencia. ? Alguna vez ha sido vctima de [place] o no se siente seguro en su casa. Resumen  Adoptar un estilo de vida saludable y recibir atencin preventiva son importantes para promover la salud y Musician.  Siga las instrucciones del mdico acerca de una dieta saludable, el ejercicio y la realizacin de pruebas o exmenes para Engineer, building services.  Siga las instrucciones del mdico con respecto al control del colesterol y la presin arterial. Esta informacin no tiene Marine scientist el consejo del mdico. Asegrese de hacerle al mdico cualquier pregunta que tenga. Document Revised: 03/17/2018 Document Reviewed: 03/17/2018 Elsevier Patient Education  2020 Elsevier Inc.      Agustina Caroli, MD [facility]

## 2020-03-22 ENCOUNTER — Encounter: Payer: Self-pay | Admitting: Emergency Medicine

## 2020-03-22 ENCOUNTER — Ambulatory Visit (INDEPENDENT_AMBULATORY_CARE_PROVIDER_SITE_OTHER): Payer: BC Managed Care – PPO | Admitting: Emergency Medicine

## 2020-03-22 ENCOUNTER — Other Ambulatory Visit: Payer: Self-pay

## 2020-03-22 VITALS — BP 128/84 | HR 97 | Temp 98.1°F | Resp 16 | Ht 65.0 in | Wt 146.0 lb

## 2020-03-22 DIAGNOSIS — K649 Unspecified hemorrhoids: Secondary | ICD-10-CM | POA: Diagnosis not present

## 2020-03-22 DIAGNOSIS — Z1211 Encounter for screening for malignant neoplasm of colon: Secondary | ICD-10-CM

## 2020-03-22 DIAGNOSIS — K599 Functional intestinal disorder, unspecified: Secondary | ICD-10-CM | POA: Diagnosis not present

## 2020-03-22 MED ORDER — DICYCLOMINE HCL 20 MG PO TABS
ORAL_TABLET | ORAL | 1 refills | Status: DC
Start: 2020-03-22 — End: 2021-05-30

## 2020-03-22 MED ORDER — HYDROCORTISONE (PERIANAL) 2.5 % EX CREA: 1.0000 "application " | TOPICAL_CREAM | Freq: Two times a day (BID) | CUTANEOUS | 3 refills | Status: DC

## 2020-03-22 NOTE — Patient Instructions (Addendum)
If you have lab work done today you will be contacted with your lab results within the next 2 weeks.  If you have not heard from [place] then please contact us. The fastest way to get your results is to register for My Chart.   IF you received an x-ray today, you will receive an invoice from [facility]. Please contact [facility] at [phone] with questions or concerns regarding your invoice.   IF you received labwork today, you will receive an invoice from [facility]. Please contact [facility] at [phone] with questions or concerns regarding your invoice.   Our billing staff will not be able to assist you with questions regarding bills from these companies.  You will be contacted with the lab results as soon as they are available. The fastest way to get your results is to activate your My Chart account. Instructions are located on the last page of this paperwork. If you have not heard from [place] regarding the results in 2 weeks, please contact this office.     Hemorroides Hemorrhoids Las hemorroides son venas inflamadas que pueden desarrollarse:  En el ano (recto). Estas se denominan hemorroides internas.  Alrededor de la abertura del ano. Estas se denominan hemorroides externas. Las hemorroides pueden causar dolor, picazn o hemorragias. Generalmente no causan problemas graves. Con frecuencia mejoran al [employer] dieta, el estilo de vida y otros tratamientos Facilities manager. Cules son las causas? Esta afeccin puede ser causada por lo siguiente:  Tener dificultad para defecar (estreimiento).  Hacer mucha fuerza (esfuerzo) para defecar.  Materia fecal lquida (diarrea).  Embarazo.  Tener mucho sobrepeso (obesidad).  Estar sentado durante largos perodos de [place].  Levantar objetos pesados u otras actividades que impliquen esfuerzo.  Sexo anal.  Andar en bicicleta por un largo perodo de tiempo. Cules son los signos o los sntomas? Los sntomas de  esta afeccin incluyen los siguientes:  Engineer, mining.  Picazn o irritacin en el ano.  Sangrado proveniente del ano.  Prdida de materia fecal.  Inflamacin en la zona.  Uno o ms bultos alrededor de la abertura del ano. Cmo se diagnostica? A menudo un mdico puede diagnosticar esta afeccin al observar la zona afectada. El mdico tambin puede:  Education officer, environmental un examen que implica palpar la zona con la mano enguantada (examen rectal digital).  Examinar el interior de la zona anal utilizando un pequeo tubo (anoscopio).  Pedir anlisis de [place]. Es posible que esto se realice si ha perdido Warden/ranger.  Solicitarle un estudio que consiste en la observacin del interior del colon utilizando un tubo flexible con una cmara en el extremo (sigmoidoscopia o colonoscopa). Cmo se trata? Esta afeccin generalmente se puede tratar en el hogar. El mdico puede indicarle que cambie de Paediatric nurse, de estilo de vida o que trate de Psychologist, sport and exercise. Si esto no da resultado, se pueden realizar procedimientos para extirpar las hemorroides o reducir Brewing technologist. Estos pueden implicar lo siguiente:  Museum/gallery conservator en la base de las hemorroides para interrumpir la irrigacin de Risk manager.  Inyectar un medicamento en las hemorroides para reducir Brewing technologist.  Dirigir un tipo de Engineer, drilling de luz hacia las hemorroides para Radio producer que se caigan.  Realizar Neomia Dear ciruga para extirpar las hemorroides o cortar la irrigacin de [place]. Siga estas indicaciones en su casa: Comida y bebida  Consuma alimentos con alto contenido de [place]. Entre ellos cereales integrales, frijoles, frutos secos, frutas y verduras.  Pregntele a su mdico acerca  de tomar productos con fibra aadida en ellos (complementos defibra).  Disminuya la cantidad de grasa de la dieta. Para esto, puede hacer lo siguiente: ? Coma productos lcteos descremados. ? Coma menos carne roja. ? No consuma alimentos  procesados.  Beba suficiente lquido para Consulting civil engineer orina de color amarillo plido.   Control del dolor y [place] un bao de agua tibia (bao de asiento) durante 20 minutos para Best boy. Hgalo 3 o 4veces al da. Puede hacer esto en una baera o usar un dispositivo porttil para bao de asiento que se coloca sobre el inodoro.  Si se lo indican, aplique hielo sobre la zona dolorida. Puede ser beneficioso aplicar hielo [employer] baos con agua tibia. ? Ponga el hielo en una bolsa plstica. ? Coloque una [employer] piel y Therapist, nutritional. ? Coloque el hielo durante 40minutos, 2 a 3veces por da.   Indicaciones generales  [employer] de venta libre y los recetados solamente como se lo haya indicado el mdico. ? Las [employer] y los medicamentos pueden usarse como se lo hayan indicado.  Haga ejercicio fsico con frecuencia. Consulte al mdico qu tipos de ejercicios son mejores para usted y qu cantidad.  Vaya al bao cuando sienta ganas de defecar. No espere.  Evite hacer demasiada fuerza al defecar.  Mantenga el ano seco y limpio. Use papel higinico hmedo o toallitas humedecidas despus de defecar.  No pase mucho tiempo sentado en el inodoro.  Concurra a todas las visitas de seguimiento como se lo haya indicado el mdico. Esto es importante. Comunquese con un mdico si:  Tiene dolor e hinchazn que no mejoran con el tratamiento o los medicamentos.  Tiene problemas para defecar.  No puede defecar.  Tiene dolor o hinchazn en la zona exterior de las hemorroides. Solicite ayuda inmediatamente si tiene:  Hemorragia que no se detiene. Resumen  Las hemorroides son venas hinchadas en el ano o la zona que rodea el ano.  Pueden causar dolor, picazn o sangrado.  Consuma alimentos con alto contenido de [place]. Entre ellos cereales integrales, frijoles, frutos secos, frutas y verduras.  Tome un bao de agua tibia (bao de asiento) durante 20  minutos para Best boy. Hgalo 3 o 4veces al da. Esta informacin no tiene Marine scientist el consejo del mdico. Asegrese de hacerle al mdico cualquier pregunta que tenga. Document Revised: 09/03/2017 Document Reviewed: 09/03/2017 Elsevier Patient Education  2021 [place] en Harvest Maintenance, Male Adoptar un estilo de vida saludable y recibir atencin preventiva son importantes para promover la salud y Musician. Consulte al mdico sobre:  El esquema adecuado para hacerse pruebas y exmenes peridicos.  Cosas que puede hacer por su cuenta para prevenir enfermedades y [place] sano. Qu debo saber sobre la dieta, el peso y el ejercicio? Consuma una dieta saludable  Consuma una dieta que incluya muchas verduras, frutas, productos lcteos con bajo contenido de [place] y Advertising account planner.  No consuma muchos alimentos ricos en grasas slidas, azcares agregados o sodio.   Mantenga un peso saludable El ndice de masa muscular [facility]) es una medida que puede utilizarse para identificar posibles problemas de [place]. Proporciona una estimacin de la grasa corporal basndose en el peso y la altura. Su mdico puede ayudarle a Radiation protection practitioner [place] y a Scientist, forensic o Theatre manager un peso saludable. Haga ejercicio con regularidad Haga ejercicio con regularidad. Esta es una de las prcticas ms importantes que  puede hacer por su salud. La mayora de los adultos deben seguir estas pautas:  Optometrist, al menos, 167minutos de actividad fsica por semana. El ejercicio debe aumentar la frecuencia cardaca y Nature conservation officer transpirar (ejercicio de intensidad moderada).  Hacer ejercicios de fortalecimiento por lo [employer] por semana. Agregue esto a su plan de ejercicio de intensidad moderada.  Pasar menos tiempo sentados. Incluso la actividad fsica ligera puede ser beneficiosa. Controle sus niveles de colesterol y lpidos en la sangre Comience a realizarse  anlisis de lpidos y Research officer, trade union en la sangre a los 20aos y luego reptalos cada 5aos. Es posible que Automotive engineer los niveles de colesterol con mayor frecuencia si:  Sus niveles de lpidos y colesterol son altos.  Es mayor de 40aos.  Presenta un alto riesgo de padecer enfermedades cardacas. Qu debo saber sobre las pruebas de deteccin del cncer? Muchos tipos de cncer pueden detectarse de manera temprana y, a menudo, pueden prevenirse. Segn su historia clnica y sus antecedentes familiares, es posible que deba realizarse pruebas de deteccin del cncer en diferentes edades. Esto puede incluir pruebas de deteccin de lo siguiente:  Surveyor, minerals.  Cncer de prstata.  Cncer de piel.  Cncer de pulmn. Qu debo saber sobre la enfermedad cardaca, la diabetes y la hipertensin arterial? Presin arterial y enfermedad cardaca  La hipertensin arterial causa enfermedades cardacas y [place] el riesgo de accidente cerebrovascular. Es ms probable que esto se manifieste en las personas que tienen lecturas de presin arterial alta, tienen ascendencia africana o tienen sobrepeso.  Hable con el mdico sobre sus valores de presin arterial deseados.  Hgase controlar la presin arterial: ? Cada 3 a 5 aos si tiene entre 18 y 62 aos. ? Todos los aos si es mayor de [place].  Si tiene entre 83 y 76 aos y es fumador o Insurance account manager, pregntele al mdico si debe realizarse una prueba de deteccin de aneurisma artico abdominal (AAA) por nica vez. Diabetes Realcese exmenes de deteccin de la diabetes con regularidad. Este anlisis revisa el nivel de azcar en la sangre en [place]. Hgase las pruebas de deteccin:  Cada tresaos despus de los 73aos de edad si tiene un peso normal y un bajo riesgo de padecer diabetes.  Con ms frecuencia y a partir de [place] edad inferior si tiene sobrepeso o un alto riesgo de padecer diabetes. Qu debo saber sobre la prevencin de  infecciones? Hepatitis B Si tiene un riesgo ms alto de contraer hepatitis B, debe someterse a un examen de deteccin de este virus. Hable con el mdico para averiguar si tiene riesgo de contraer la infeccin por hepatitis B. Hepatitis C Se recomienda un anlisis de [place] para:  Todos los que nacieron entre 1947 y [phone].  Todas las personas que tengan un riesgo de haber contrado hepatitis C. Enfermedades de transmisin sexual (ETS)  Debe realizarse pruebas de deteccin de ITS todos los aos, incluidas la gonorrea y la clamidia, si: ? Es sexualmente activo y es menor de 24aos. ? Es mayor de 24aos, y Investment banker, operational informa que corre riesgo de tener este tipo de infecciones. ? La actividad sexual ha cambiado desde que le hicieron la ltima prueba de deteccin y tiene un riesgo mayor de Best boy clamidia o Radio broadcast assistant. Pregntele al mdico si usted tiene riesgo.  Pregntele al mdico si usted tiene un alto riesgo de Museum/gallery curator VIH. El mdico tambin puede recomendarle un medicamento recetado para ayudar a evitar la infeccin por el VIH. Si elige tomar medicamentos para  prevenir el VIH, primero debe [employer] de deteccin del VIH. Luego debe hacerse anlisis cada mientras est tomando los medicamentos. Siga estas instrucciones en su casa: Estilo de vida  No consuma ningn producto que contenga nicotina o tabaco, como cigarrillos, cigarrillos electrnicos y tabaco de Theatre manager. Si necesita ayuda para dejar de fumar, consulte al mdico.  No consuma drogas.  No comparta agujas.  Solicite ayuda a su mdico si necesita apoyo o informacin para abandonar las drogas. Consumo de alcohol  No beba alcohol si el mdico se lo prohbe.  Si bebe alcohol: ? Limite la cantidad que consume de 0 a 2 medidas por da. ? Est atento a la cantidad de alcohol que hay en las bebidas que toma. En los [place], una medida equivale a una botella de cerveza de 12oz ( ), un vaso de vino de 5oz  ( ) o un vaso de una bebida alcohlica de alta graduacin de 1oz (55ml). Instrucciones generales  Realcese los estudios de rutina de la salud, dentales y de Wellsite geologist.  Mantngase al da con las vacunas.  Infrmele a su mdico si: ? Se siente deprimido con frecuencia. ? Alguna vez ha sido vctima de [place] o no se siente seguro en su casa. Resumen  Adoptar un estilo de vida saludable y recibir atencin preventiva son importantes para promover la salud y Counsellor.  Siga las instrucciones del mdico acerca de una dieta saludable, el ejercicio y la realizacin de pruebas o exmenes para Hotel manager.  Siga las instrucciones del mdico con respecto al control del colesterol y la presin arterial. Esta informacin no tiene Theme park manager el consejo del mdico. Asegrese de hacerle al mdico cualquier pregunta que tenga. Document Revised: 03/17/2018 Document Reviewed: 03/17/2018 Elsevier Patient Education  2021 [employer].

## 2020-03-22 NOTE — Progress Notes (Signed)
Lee Watson 50 y.o.   Chief Complaint  Patient presents with  . Diarrhea    Per patient it can be after eating breakfast or lunch     HISTORY OF PRESENT ILLNESS: This is a 50 y.o. male complaining of episodes of small amounts of diarrhea followed by increased amounts of gas for the past 2 to [redacted] weeks along with intermittent diffuse abdominal cramping. No changes in his usual nutritional habits.  Still eating the same things. Has history of hemorrhoids and has seen some blood with the stools and in the toilet paper lately. Able to eat and drink.  Denies nausea or vomiting.  Denies fever or chills. No other complaints or medical concerns today. No new medications.  History of hypertension on losartan-HCTZ 50-12.5 mg daily.  HPI   Prior to Admission medications   Medication Sig Start Date End Date Taking? Authorizing Provider  aspirin 325 MG tablet Take 325 mg by mouth as needed.   Yes [provider]  b complex vitamins capsule Take 1 capsule by mouth daily.   Yes [provider]  losartan-hydrochlorothiazide (HYZAAR) 50-12.5 MG tablet Take 1 tablet by mouth daily. 09/09/19  Yes Julie-Ann Vanmaanen, Ines Bloomer, MD  OVER THE COUNTER MEDICATION    Yes [provider]    No Known Allergies  Patient Active Problem List   Diagnosis Date Noted  . Essential hypertension 06/30/2019  . Current smoker 06/30/2019    History reviewed. No pertinent past medical history.  History reviewed. No pertinent surgical history.  Social History   Socioeconomic History  . Marital status: Married    Spouse name: Not on file  . Number of children: Not on file  . Years of education: Not on file  . Highest education level: Not on file  Occupational History  . Not on file  Tobacco Use  . Smoking status: Current Every Day Smoker    Years: 25.00    Types: Cigarettes  . Smokeless tobacco: Never Used  . Tobacco comment: 8-9 a day cigarettes  Substance and Sexual Activity   . Alcohol use: Yes    Comment: cans of beers on weekends  . Drug use: Never  . Sexual activity: Not on file  Other Topics Concern  . Not on file  Social History Narrative  . Not on file   Social Determinants of Health   Financial Resource Strain: Not on file  Food Insecurity: Not on file  Transportation Needs: Not on file  Physical Activity: Not on file  Stress: Not on file  Social Connections: Not on file  Intimate Partner Violence: Not on file    History reviewed. No pertinent family history.   Review of Systems  Constitutional: Negative.  Negative for chills and fever.  HENT: Negative.  Negative for congestion and sore throat.   Respiratory: Negative.  Negative for cough and shortness of breath.   Cardiovascular: Negative.  Negative for chest pain and palpitations.  Gastrointestinal: Positive for abdominal pain and diarrhea.  Genitourinary: Negative.  Negative for dysuria and hematuria.  Musculoskeletal: Negative.  Negative for back pain, myalgias and neck pain.  Skin: Negative.  Negative for rash.  Neurological: Negative.  Negative for dizziness and headaches.  All other systems reviewed and are negative.    Today's Vitals   03/22/20 1027  BP: 128/84  Pulse: 97  Resp: 16  Temp: 98.1 F (36.7 C)  TempSrc: Temporal  SpO2: 97%  Weight: 146 lb (66.2 kg)  Height: 5\' 5"  (1.651  m)   Body mass index is 24.3 kg/m.   Physical Exam Vitals reviewed.  Constitutional:      Appearance: Normal appearance.  HENT:     Head: Normocephalic.  Eyes:     Extraocular Movements: Extraocular movements intact.     Pupils: Pupils are equal, round, and reactive to light.  Cardiovascular:     Rate and Rhythm: Normal rate and regular rhythm.     Pulses: Normal pulses.     Heart sounds: Normal heart sounds.  Pulmonary:     Effort: Pulmonary effort is normal.     Breath sounds: Normal breath sounds.  Abdominal:     General: Bowel sounds are normal. There is no distension.      Palpations: Abdomen is soft.     Tenderness: There is no abdominal tenderness.  Musculoskeletal:        General: Normal range of motion.     Cervical back: Normal range of motion and neck supple.  Skin:    General: Skin is warm and dry.     Capillary Refill: Capillary refill takes less than 2 seconds.  Neurological:     General: No focal deficit present.     Mental Status: He is alert and oriented to person, place, and time.  Psychiatric:        Mood and Affect: Mood normal.        Behavior: Behavior normal.      ASSESSMENT & PLAN: Stevan was seen today for diarrhea.  Diagnoses and all orders for this visit:  Hemorrhoids, unspecified hemorrhoid type -     hydrocortisone (ANUSOL-HC) 2.5 % rectal cream; Place 1 application rectally 2 (two) times daily.  Functional bowel disorder -     dicyclomine (BENTYL) 20 MG tablet; Twice a day for 3 days and then every 6-8 hours as needed.  Colon cancer screening -     Ambulatory referral to Gastroenterology    Patient Instructions       If you have lab work done today you will be contacted with your lab results within the next 2 weeks.  If you have not heard from [place] then please contact us. The fastest way to get your results is to register for My Chart.   IF you received an x-ray today, you will receive an invoice from [facility]. Please contact [facility] at [phone] with questions or concerns regarding your invoice.   IF you received labwork today, you will receive an invoice from [facility]. Please contact [facility] at [phone] with questions or concerns regarding your invoice.   Our billing staff will not be able to assist you with questions regarding bills from these companies.  You will be contacted with the lab results as soon as they are available. The fastest way to get your results is to activate your My Chart account. Instructions are located on the last page of this paperwork. If you have not  heard from [place] regarding the results in 2 weeks, please contact this office.     Hemorroides Hemorrhoids Las hemorroides son venas inflamadas que pueden desarrollarse:  En el ano (recto). Estas se denominan hemorroides internas.  Alrededor de la abertura del ano. Estas se denominan hemorroides externas. Las hemorroides pueden causar dolor, picazn o hemorragias. Generalmente no causan problemas graves. Con frecuencia mejoran al [employer] dieta, el estilo de vida y otros tratamientos Financial planner. Cules son las causas? Esta afeccin puede ser causada por lo siguiente:  Tener dificultad para defecar (estreimiento).  Hacer mucha fuerza (esfuerzo) para defecar.  Materia fecal lquida (diarrea).  Embarazo.  Tener mucho sobrepeso (obesidad).  Estar sentado durante largos perodos de [place].  Levantar objetos pesados u otras actividades que impliquen esfuerzo.  Sexo anal.  Andar en bicicleta por un largo perodo de tiempo. Cules son los signos o los sntomas? Los sntomas de esta afeccin incluyen los siguientes:  Social research officer, government.  Picazn o irritacin en el ano.  Sangrado proveniente del ano.  Prdida de materia fecal.  Inflamacin en la zona.  Uno o ms bultos alrededor de la abertura del ano. Cmo se diagnostica? A menudo un mdico puede diagnosticar esta afeccin al observar la zona afectada. El mdico tambin puede:  Optometrist un examen que implica palpar la zona con la mano enguantada (examen rectal digital).  Examinar el interior de la zona anal utilizando un pequeo tubo (anoscopio).  Pedir anlisis de [place]. Es posible que esto se realice si ha perdido Optometrist.  Solicitarle un estudio que consiste en la observacin del interior del colon utilizando un tubo flexible con una cmara en el extremo (sigmoidoscopia o colonoscopa). Cmo se trata? Esta afeccin generalmente se puede tratar en el hogar. El mdico puede indicarle que cambie de Designer, multimedia, de  estilo de vida o que trate de Physicist, medical. Si esto no da resultado, se pueden realizar procedimientos para extirpar las hemorroides o reducir Publishing rights manager. Estos pueden implicar lo siguiente:  Building services engineer en la base de las hemorroides para interrumpir la irrigacin de Herbalist.  Inyectar un medicamento en las hemorroides para reducir Publishing rights manager.  Dirigir un tipo de Teacher, early years/pre de luz hacia las hemorroides para Field seismologist que se caigan.  Realizar Ardelia Mems ciruga para extirpar las hemorroides o cortar la irrigacin de [place]. Siga estas indicaciones en su casa: Comida y bebida  Consuma alimentos con alto contenido de [place]. Entre ellos cereales integrales, frijoles, frutos secos, frutas y verduras.  Pregntele a su mdico acerca de tomar productos con fibra aadida en ellos (complementos defibra).  Disminuya la cantidad de grasa de la dieta. Para esto, puede hacer lo siguiente: ? Coma productos lcteos descremados. ? Coma menos carne roja. ? No consuma alimentos procesados.  Beba suficiente lquido para Consulting civil engineer orina de color amarillo plido.   Control del dolor y [place] un bao de agua tibia (bao de asiento) durante 20 minutos para Best boy. Hgalo 3 o 4veces al da. Puede hacer esto en una baera o usar un dispositivo porttil para bao de asiento que se coloca sobre el inodoro.  Si se lo indican, aplique hielo sobre la zona dolorida. Puede ser beneficioso aplicar hielo [employer] baos con agua tibia. ? Ponga el hielo en una bolsa plstica. ? Coloque una [employer] piel y Therapist, nutritional. ? Coloque el hielo durante 69minutos, 2 a 3veces por da.   Indicaciones generales  [employer] de venta libre y los recetados solamente como se lo haya indicado el mdico. ? Las [employer] y los medicamentos pueden usarse como se lo hayan indicado.  Haga ejercicio fsico con frecuencia. Consulte al mdico qu tipos de ejercicios son  mejores para usted y qu cantidad.  Vaya al bao cuando sienta ganas de defecar. No espere.  Evite hacer demasiada fuerza al defecar.  Mantenga el ano seco y limpio. Use papel higinico hmedo o toallitas humedecidas despus de defecar.  No pase mucho tiempo sentado en el inodoro.  Concurra a [place]  seguimiento como se lo haya indicado el mdico. Esto es importante. Comunquese con un mdico si:  Tiene dolor e hinchazn que no mejoran con el tratamiento o los medicamentos.  Tiene problemas para defecar.  No puede defecar.  Tiene dolor o hinchazn en la zona exterior de las hemorroides. Solicite ayuda inmediatamente si tiene:  Hemorragia que no se detiene. Resumen  Las hemorroides son venas hinchadas en el ano o la zona que rodea el ano.  Pueden causar dolor, picazn o sangrado.  Consuma alimentos con alto contenido de [place]. Entre ellos cereales integrales, frijoles, frutos secos, frutas y verduras.  Tome un bao de agua tibia (bao de asiento) durante 20 minutos para Best boy. Hgalo 3 o 4veces al da. Esta informacin no tiene Marine scientist el consejo del mdico. Asegrese de hacerle al mdico cualquier pregunta que tenga. Document Revised: 09/03/2017 Document Reviewed: 09/03/2017 Elsevier Patient Education  2021 [place] en [place] Maintenance, Male Adoptar un estilo de vida saludable y recibir atencin preventiva son importantes para promover la salud y Musician. Consulte al mdico sobre:  El esquema adecuado para hacerse pruebas y exmenes peridicos.  Cosas que puede hacer por su cuenta para prevenir enfermedades y [place] sano. Qu debo saber sobre la dieta, el peso y el ejercicio? Consuma una dieta saludable  Consuma una dieta que incluya muchas verduras, frutas, productos lcteos con bajo contenido de [place] y Advertising account planner.  No consuma muchos alimentos ricos en grasas slidas,  azcares agregados o sodio.   Mantenga un peso saludable El ndice de masa muscular [facility]) es una medida que puede utilizarse para identificar posibles problemas de [place]. Proporciona una estimacin de la grasa corporal basndose en el peso y la altura. Su mdico puede ayudarle a Radiation protection practitioner [place] y a Scientist, forensic o Theatre manager un peso saludable. Haga ejercicio con regularidad Haga ejercicio con regularidad. Esta es una de las prcticas ms importantes que puede hacer por su salud. La mayora de los adultos deben seguir estas pautas:  Optometrist, al menos, 133minutos de actividad fsica por semana. El ejercicio debe aumentar la frecuencia cardaca y Nature conservation officer transpirar (ejercicio de intensidad moderada).  Hacer ejercicios de fortalecimiento por lo [employer] por semana. Agregue esto a su plan de ejercicio de intensidad moderada.  Pasar menos tiempo sentados. Incluso la actividad fsica ligera puede ser beneficiosa. Controle sus niveles de colesterol y lpidos en la sangre Comience a realizarse anlisis de lpidos y Research officer, trade union en la sangre a los 20aos y luego reptalos cada 5aos. Es posible que Automotive engineer los niveles de colesterol con mayor frecuencia si:  Sus niveles de lpidos y colesterol son altos.  Es mayor de 40aos.  Presenta un alto riesgo de padecer enfermedades cardacas. Qu debo saber sobre las pruebas de deteccin del cncer? Muchos tipos de cncer pueden detectarse de manera temprana y, a menudo, pueden prevenirse. Segn su historia clnica y sus antecedentes familiares, es posible que deba realizarse pruebas de deteccin del cncer en diferentes edades. Esto puede incluir pruebas de deteccin de lo siguiente:  Surveyor, minerals.  Cncer de prstata.  Cncer de piel.  Cncer de pulmn. Qu debo saber sobre la enfermedad cardaca, la diabetes y la hipertensin arterial? Presin arterial y enfermedad cardaca  La hipertensin arterial causa enfermedades cardacas y  [place] el riesgo de accidente cerebrovascular. Es ms probable que esto se manifieste en las personas que tienen lecturas de presin arterial alta, tienen ascendencia africana o tienen sobrepeso.  Hable con el mdico sobre sus valores de presin arterial deseados.  Hgase controlar la presin arterial: ? Cada 3 a 5 aos si tiene entre 18 y 49 aos. ? Todos los aos si es mayor de [place].  Si tiene entre 63 y 66 aos y es fumador o Insurance account manager, pregntele al mdico si debe realizarse una prueba de deteccin de aneurisma artico abdominal (AAA) por nica vez. Diabetes Realcese exmenes de deteccin de la diabetes con regularidad. Este anlisis revisa el nivel de azcar en la sangre en [place]. Hgase las pruebas de deteccin:  Cada tresaos despus de los 73aos de edad si tiene un peso normal y un bajo riesgo de padecer diabetes.  Con ms frecuencia y a partir de [place] edad inferior si tiene sobrepeso o un alto riesgo de padecer diabetes. Qu debo saber sobre la prevencin de infecciones? Hepatitis B Si tiene un riesgo ms alto de contraer hepatitis B, debe someterse a un examen de deteccin de este virus. Hable con el mdico para averiguar si tiene riesgo de contraer la infeccin por hepatitis B. Hepatitis C Se recomienda un anlisis de [place] para:  Todos los que nacieron entre 1945 y [phone].  Todas las personas que tengan un riesgo de haber contrado hepatitis C. Enfermedades de transmisin sexual (ETS)  Debe realizarse pruebas de deteccin de ITS todos los aos, incluidas la gonorrea y la clamidia, si: ? Es sexualmente activo y es menor de 24aos. ? Es mayor de 24aos, y Investment banker, operational informa que corre riesgo de tener este tipo de infecciones. ? La actividad sexual ha cambiado desde que le hicieron la ltima prueba de deteccin y tiene un riesgo mayor de Best boy clamidia o Radio broadcast assistant. Pregntele al mdico si usted tiene riesgo.  Pregntele al mdico si usted tiene un alto riesgo de  Museum/gallery curator VIH. El mdico tambin puede recomendarle un medicamento recetado para ayudar a evitar la infeccin por el VIH. Si elige tomar medicamentos para prevenir el VIH, primero debe [employer] de deteccin del VIH. Luego debe hacerse anlisis cada 92meses mientras est tomando los medicamentos. Siga estas instrucciones en su casa: Estilo de vida  No consuma ningn producto que contenga nicotina o tabaco, como cigarrillos, cigarrillos electrnicos y tabaco de Higher education careers adviser. Si necesita ayuda para dejar de fumar, consulte al mdico.  No consuma drogas.  No comparta agujas.  Solicite ayuda a su mdico si necesita apoyo o informacin para abandonar las drogas. Consumo de alcohol  No beba alcohol si el mdico se lo prohbe.  Si bebe alcohol: ? Limite la cantidad que consume de 0 a 2 medidas por da. ? Est atento a la cantidad de alcohol que hay en las bebidas que toma. En los [place], una medida equivale a una botella de cerveza de 12oz (324ml), un vaso de vino de 5oz (173ml) o un vaso de una bebida alcohlica de alta graduacin de 1oz (61ml). Instrucciones generales  Realcese los estudios de rutina de la salud, dentales y de Public librarian.  [place].  Infrmele a su mdico si: ? Se siente deprimido con frecuencia. ? Alguna vez ha sido vctima de [place] o no se siente seguro en su casa. Resumen  Adoptar un estilo de vida saludable y recibir atencin preventiva son importantes para promover la salud y Musician.  Siga las instrucciones del mdico acerca de una dieta saludable, el ejercicio y la realizacin de pruebas o exmenes para Engineer, building services.  [place]  con respecto al control del colesterol y la presin arterial. Esta informacin no tiene Marine scientist el consejo del mdico. Asegrese de hacerle al mdico cualquier pregunta que tenga. Document Revised: 03/17/2018 Document Reviewed:  03/17/2018 Elsevier Patient Education  2021 Elsevier Inc.      Agustina Caroli, MD [facility]

## 2020-03-27 ENCOUNTER — Encounter: Payer: Self-pay | Admitting: Internal Medicine

## 2020-05-08 ENCOUNTER — Ambulatory Visit (AMBULATORY_SURGERY_CENTER): Payer: Self-pay | Admitting: *Deleted

## 2020-05-08 ENCOUNTER — Other Ambulatory Visit: Payer: Self-pay

## 2020-05-08 VITALS — Ht 65.0 in | Wt 152.0 lb

## 2020-05-08 DIAGNOSIS — Z1211 Encounter for screening for malignant neoplasm of colon: Secondary | ICD-10-CM

## 2020-05-08 MED ORDER — SUTAB 1479-225-188 MG PO TABS: 24.0000 | ORAL_TABLET | ORAL | 0 refills | Status: DC

## 2020-05-08 NOTE — Progress Notes (Signed)
No egg or soy allergy known to patient  No issues with past sedation with any surgeries or procedures No intubation problems in the past  No FH of Malignant Hyperthermia No diet pills per patient No home 02 use per patient  No blood thinners per patient  Pt states  issues with constipation is very OCC  No A fib or A flutter  EMMI video to pt or via [employer] 19 guidelines implemented in PV today with Pt and RN  Pt is not  vaccinated  for [employer] given to pt in PV today , Code to Pharmacy and  NO PA's for preps discussed with pt In PV today  Discussed with pt there will be an out-of-pocket cost for prep and that varies from $0 to 70 dollars   Due to the COVID-19 pandemic we are asking patients to follow certain guidelines.  Pt aware of COVID protocols and [facility] guidelines

## 2020-05-11 ENCOUNTER — Encounter: Payer: Self-pay | Admitting: Internal Medicine

## 2020-05-22 ENCOUNTER — Ambulatory Visit (AMBULATORY_SURGERY_CENTER): Payer: BC Managed Care – PPO | Admitting: Internal Medicine

## 2020-05-22 ENCOUNTER — Encounter: Payer: Self-pay | Admitting: Internal Medicine

## 2020-05-22 ENCOUNTER — Other Ambulatory Visit: Payer: Self-pay

## 2020-05-22 VITALS — BP 93/72 | HR 80 | Temp 97.3°F | Resp 13 | Ht 65.0 in | Wt 152.0 lb

## 2020-05-22 DIAGNOSIS — D125 Benign neoplasm of sigmoid colon: Secondary | ICD-10-CM

## 2020-05-22 DIAGNOSIS — K5289 Other specified noninfective gastroenteritis and colitis: Secondary | ICD-10-CM

## 2020-05-22 DIAGNOSIS — K639 Disease of intestine, unspecified: Secondary | ICD-10-CM

## 2020-05-22 DIAGNOSIS — Z1211 Encounter for screening for malignant neoplasm of colon: Secondary | ICD-10-CM

## 2020-05-22 DIAGNOSIS — K5140 Inflammatory polyps of colon without complications: Secondary | ICD-10-CM

## 2020-05-22 DIAGNOSIS — K625 Hemorrhage of anus and rectum: Secondary | ICD-10-CM

## 2020-05-22 DIAGNOSIS — K648 Other hemorrhoids: Secondary | ICD-10-CM

## 2020-05-22 DIAGNOSIS — K529 Noninfective gastroenteritis and colitis, unspecified: Secondary | ICD-10-CM | POA: Diagnosis not present

## 2020-05-22 MED ORDER — SODIUM CHLORIDE 0.9 % IV SOLN: 500.0000 mL | Freq: Once | INTRAVENOUS | Status: DC

## 2020-05-22 NOTE — Patient Instructions (Signed)
Polyps handout given: Resume previous diet Continue current medications Start fiber supplemetion of metamucil 1-2 tablespoons daily Await pathology results  YOU HAD AN ENDOSCOPIC PROCEDURE TODAY AT [place]:   Refer to the procedure report that was given to you for any specific questions about what was found during the examination.  If the procedure report does not answer your questions, please call your gastroenterologist to clarify.  If you requested that your care partner not be given the details of your procedure findings, then the procedure report has been included in a sealed envelope for you to review at your convenience later.  YOU SHOULD EXPECT: Some feelings of bloating in the abdomen. Passage of more gas than usual.  Walking can help get rid of the air that was put into your GI tract during the procedure and reduce the bloating. If you had a lower endoscopy (such as a colonoscopy or flexible sigmoidoscopy) you may notice spotting of blood in your stool or on the toilet paper. If you underwent a bowel prep for your procedure, you may not have a normal bowel movement for a few days.  Please Note:  You might notice some irritation and congestion in your nose or some drainage.  This is from the oxygen used during your procedure.  There is no need for concern and it should clear up in a day or so.  SYMPTOMS TO REPORT IMMEDIATELY:   Following lower endoscopy (colonoscopy or flexible sigmoidoscopy):  Excessive amounts of blood in the stool  Significant tenderness or worsening of abdominal pains  Swelling of the abdomen that is new, acute  Fever of 100F or higher  For urgent or emergent issues, a gastroenterologist can be reached at any hour by calling [phone]. Do not use MyChart messaging for urgent concerns.   DIET:  We do recommend a small meal at first, but then you may proceed to your regular diet.  Drink plenty of fluids but you should avoid alcoholic  beverages for 24 hours.  ACTIVITY:  You should plan to take it easy for the rest of today and you should NOT DRIVE or use heavy machinery until tomorrow (because of the sedation medicines used during the test).    FOLLOW UP: Our staff will call the number listed on your records 48-72 hours following your procedure to check on you and address any questions or concerns that you may have regarding the information given to you following your procedure. If we do not reach you, we will leave a message.  We will attempt to reach you two times.  During this call, we will ask if you have developed any symptoms of COVID 19. If you develop any symptoms (ie: fever, flu-like symptoms, shortness of breath, cough etc.) before then, please call [phone].  If you test positive for Covid 19 in the 2 weeks post procedure, please call and report this information to [place].    If any biopsies were taken you will be contacted by phone or by letter within the next 1-3 weeks.  Please call us at [phone] if you have not heard about the biopsies in 3 weeks.   SIGNATURES/CONFIDENTIALITY: You and/or your care partner have signed paperwork which will be entered into your electronic medical record.  These signatures attest to the fact that that the information above on your After Visit Summary has been reviewed and is understood.  Full responsibility of the confidentiality of this discharge information lies with you and/or your care-partner.

## 2020-05-22 NOTE — Op Note (Signed)
[place] Patient Name: Lee Watson Procedure Date: 05/22/2020 8:46 AM MRN: [id number] Endoscopist: Docia Chuck. Henrene Pastor , MD Age: 50 Referring MD:  Date of Birth: Feb 06, 1971 Gender: Male Account #: [id number] Procedure:                Colonoscopy with cold snare polypectomy x1; with                            biopsies Indications:              Screening for colorectal malignant neoplasm. Minor                            rectal bleeding Medicines:                Monitored Anesthesia Care Procedure:                Pre-Anesthesia Assessment:                           - Prior to the procedure, a History and Physical                            was performed, and patient medications and                            allergies were reviewed. The patient's tolerance of                            previous anesthesia was also reviewed. The risks                            and benefits of the procedure and the sedation                            options and risks were discussed with the patient.                            All questions were answered, and informed consent                            was obtained. Prior Anticoagulants: The patient has                            taken no previous anticoagulant or antiplatelet                            agents. ASA Grade Assessment: II - A patient with                            mild systemic disease. After reviewing the risks                            and benefits, the patient was deemed in  satisfactory condition to undergo the procedure.                           After obtaining informed consent, the colonoscope                            was passed under direct vision. Throughout the                            procedure, the patient's blood pressure, pulse, and                            oxygen saturations were monitored continuously. The                            Olympus CF-[id number]) [id number] was  introduced                            through the anus and advanced to the the cecum,                            identified by appendiceal orifice and ileocecal                            valve. The ileocecal valve, appendiceal orifice,                            and rectum were photographed. The quality of the                            bowel preparation was excellent. The colonoscopy                            was performed without difficulty. The patient                            tolerated the procedure well. The bowel preparation                            used was SUPREP via split dose instruction. Scope In: 9:08:52 AM Scope Out: 9:25:56 AM Scope Withdrawal Time: 0 hours 11 minutes 33 seconds  Total Procedure Duration: 0 hours 17 minutes 4 seconds  Findings:                 A 3 mm polyp was found in the sigmoid colon. The                            polyp was removed with a cold snare. Resection and                            retrieval were complete.                           Internal hemorrhoids were found during  retroflexion. The hemorrhoids were small.                           The cecum revealed a small area of nonspecific                            focal inflammation. This was biopsied with a cold                            forceps for diagnostic purposes. The colon was                            otherwise normal on direct and retroflexed views. Complications:            No immediate complications. Estimated blood loss:                            None. Estimated Blood Loss:     Estimated blood loss: none. Impression:               - One 3 mm polyp in the sigmoid colon, removed with                            a cold snare. Resected and retrieved.                           - Internal hemorrhoids. Small.                           - Mild focal inflammation of the cecum biopsied                           - The entire examined colon is normal on direct and                             retroflexion views. Recommendation:           - Repeat colonoscopy in 7-10 years for surveillance.                           - Patient has a contact number available for                            emergencies. The signs and symptoms of potential                            delayed complications were discussed with the                            patient. Return to normal activities tomorrow.                            Written discharge instructions were provided to the  patient.                           - Resume previous diet.                           - Continue present medications.                           - Await pathology results.                           - Recommend fiber supplement Metamucil 1 to 2                            tablespoons daily. This will improve the bowel                            consistency and reduce intermittent hemorrhoidal                            irritation Shalondra Wunschel N. Henrene Pastor, MD 05/22/2020 9:34:35 AM This report has been signed electronically.

## 2020-05-22 NOTE — Progress Notes (Signed)
PT taken to PACU. Monitors in place. VSS. Report given to RN. 

## 2020-05-22 NOTE — Progress Notes (Signed)
Pt's states no medical or surgical changes since previsit or office visit. 

## 2020-05-22 NOTE — Progress Notes (Signed)
C.W. vital signs. 

## 2020-05-24 ENCOUNTER — Telehealth: Payer: Self-pay | Admitting: *Deleted

## 2020-05-24 ENCOUNTER — Telehealth: Payer: Self-pay

## 2020-05-24 NOTE — Telephone Encounter (Signed)
  Follow up Call-  Call back number 05/22/2020  Post procedure Call Back phone  # [phone]  Permission to leave phone message Yes  Some recent data might be hidden   LMOM to call back with any questions or concerns.  Also, call back if patient has developed fever, respiratory issues or been dx with COVID or had any family members or close contacts diagnosed since her procedure.

## 2020-05-24 NOTE — Telephone Encounter (Signed)
NO ANSWER, MESSAGE LEFT FOR PATIENT. 

## 2020-05-30 ENCOUNTER — Encounter: Payer: Self-pay | Admitting: Internal Medicine

## 2020-06-27 ENCOUNTER — Encounter: Payer: Self-pay | Admitting: Emergency Medicine

## 2020-06-27 ENCOUNTER — Other Ambulatory Visit: Payer: Self-pay

## 2020-06-27 ENCOUNTER — Ambulatory Visit (INDEPENDENT_AMBULATORY_CARE_PROVIDER_SITE_OTHER): Payer: BC Managed Care – PPO | Admitting: Emergency Medicine

## 2020-06-27 VITALS — BP 120/70 | HR 86 | Temp 98.7°F | Ht 65.0 in | Wt 151.0 lb

## 2020-06-27 DIAGNOSIS — G4700 Insomnia, unspecified: Secondary | ICD-10-CM

## 2020-06-27 DIAGNOSIS — I10 Essential (primary) hypertension: Secondary | ICD-10-CM

## 2020-06-27 MED ORDER — HYDROXYZINE PAMOATE 25 MG PO CAPS: 25.0000 mg | ORAL_CAPSULE | Freq: Every evening | ORAL | 2 refills | Status: DC | PRN
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
## 2020-06-27 NOTE — Patient Instructions (Signed)
Mantenimiento de Teacher, English as a foreign language, Male Adoptar un estilo de vida saludable y recibir atencin preventiva son importantes para promover la salud y Musician. Consulte al mdico sobre:  El esquema adecuado para hacerse pruebas y exmenes peridicos.  Cosas que puede hacer por su cuenta para prevenir enfermedades y [place] sano. Qu debo saber sobre la dieta, el peso y el ejercicio? Consuma una dieta saludable  Consuma una dieta que incluya muchas verduras, frutas, productos lcteos con bajo contenido de [place] y Advertising account planner.  No consuma muchos alimentos ricos en grasas slidas, azcares agregados o sodio.   Mantenga un peso saludable El ndice de masa muscular [facility]) es una medida que puede utilizarse para identificar posibles problemas de [place]. Proporciona una estimacin de la grasa corporal basndose en el peso y la altura. Su mdico puede ayudarle a Radiation protection practitioner [place] y a Scientist, forensic o Theatre manager un peso saludable. Haga ejercicio con regularidad Haga ejercicio con regularidad. Esta es una de las prcticas ms importantes que puede hacer por su salud. La mayora de los adultos deben seguir estas pautas:  Optometrist, al menos, 144minutos de actividad fsica por semana. El ejercicio debe aumentar la frecuencia cardaca y Nature conservation officer transpirar (ejercicio de intensidad moderada).  Hacer ejercicios de fortalecimiento por lo [employer] por semana. Agregue esto a su plan de ejercicio de intensidad moderada.  Pasar menos tiempo sentados. Incluso la actividad fsica ligera puede ser beneficiosa. Controle sus niveles de colesterol y lpidos en la sangre Comience a realizarse anlisis de lpidos y Research officer, trade union en la sangre a los 20aos y luego reptalos cada 5aos. Es posible que Automotive engineer los niveles de colesterol con mayor frecuencia si:  Sus niveles de lpidos y colesterol son altos.  Es mayor de 40aos.  Presenta un alto riesgo de padecer enfermedades  cardacas. Qu debo saber sobre las pruebas de deteccin del cncer? Muchos tipos de cncer pueden detectarse de manera temprana y, a menudo, pueden prevenirse. Segn su historia clnica y sus antecedentes familiares, es posible que deba realizarse pruebas de deteccin del cncer en diferentes edades. Esto puede incluir pruebas de deteccin de lo siguiente:  Surveyor, minerals.  Cncer de prstata.  Cncer de piel.  Cncer de pulmn. Qu debo saber sobre la enfermedad cardaca, la diabetes y la hipertensin arterial? Presin arterial y enfermedad cardaca  La hipertensin arterial causa enfermedades cardacas y [place] el riesgo de accidente cerebrovascular. Es ms probable que esto se manifieste en las personas que tienen lecturas de presin arterial alta, tienen ascendencia africana o tienen sobrepeso.  Hable con el mdico sobre sus valores de presin arterial deseados.  Hgase controlar la presin arterial: ? Cada 3 a 5 aos si tiene entre 18 y 70 aos. ? Todos los aos si es mayor de [place].  Si tiene entre 60 y 5 aos y es fumador o Insurance account manager, pregntele al mdico si debe realizarse una prueba de deteccin de aneurisma artico abdominal (AAA) por nica vez. Diabetes Realcese exmenes de deteccin de la diabetes con regularidad. Este anlisis revisa el nivel de azcar en la sangre en [place]. Hgase las pruebas de deteccin:  Cada tresaos despus de los 69aos de edad si tiene un peso normal y un bajo riesgo de padecer diabetes.  Con ms frecuencia y a partir de [place] edad inferior si tiene sobrepeso o un alto riesgo de padecer diabetes. Qu debo saber sobre la prevencin de infecciones? Hepatitis B Si tiene un riesgo ms alto de contraer hepatitis B, debe  someterse a un examen de deteccin de este virus. Hable con el mdico para averiguar si tiene riesgo de contraer la infeccin por hepatitis B. Hepatitis C Se recomienda un anlisis de [place] para:  Todos los que  nacieron entre 1945 y [phone].  Todas las personas que tengan un riesgo de haber contrado hepatitis C. Enfermedades de transmisin sexual (ETS)  Debe realizarse pruebas de deteccin de ITS todos los aos, incluidas la gonorrea y la clamidia, si: ? Es sexualmente activo y es menor de 24aos. ? Es mayor de 24aos, y Investment banker, operational informa que corre riesgo de tener este tipo de infecciones. ? La actividad sexual ha cambiado desde que le hicieron la ltima prueba de deteccin y tiene un riesgo mayor de Best boy clamidia o Radio broadcast assistant. Pregntele al mdico si usted tiene riesgo.  Pregntele al mdico si usted tiene un alto riesgo de Museum/gallery curator VIH. El mdico tambin puede recomendarle un medicamento recetado para ayudar a evitar la infeccin por el VIH. Si elige tomar medicamentos para prevenir el VIH, primero debe [employer] de deteccin del VIH. Luego debe hacerse anlisis cada 62meses mientras est tomando los medicamentos. Siga estas instrucciones en su casa: Estilo de vida  No consuma ningn producto que contenga nicotina o tabaco, como cigarrillos, cigarrillos electrnicos y tabaco de Higher education careers adviser. Si necesita ayuda para dejar de fumar, consulte al mdico.  No consuma drogas.  No comparta agujas.  Solicite ayuda a su mdico si necesita apoyo o informacin para abandonar las drogas. Consumo de alcohol  No beba alcohol si el mdico se lo prohbe.  Si bebe alcohol: ? Limite la cantidad que consume de 0 a 2 medidas por da. ? Est atento a la cantidad de alcohol que hay en las bebidas que toma. En los [place], una medida equivale a una botella de cerveza de 12oz (325ml), un vaso de vino de 5oz (155ml) o un vaso de una bebida alcohlica de alta graduacin de 1oz (32ml). Instrucciones generales  Realcese los estudios de rutina de la salud, dentales y de Public librarian.  [place].  Infrmele a su mdico si: ? Se siente deprimido con frecuencia. ? Alguna vez  ha sido vctima de [place] o no se siente seguro en su casa. Resumen  Adoptar un estilo de vida saludable y recibir atencin preventiva son importantes para promover la salud y Musician.  Siga las instrucciones del mdico acerca de una dieta saludable, el ejercicio y la realizacin de pruebas o exmenes para Engineer, building services.  Siga las instrucciones del mdico con respecto al control del colesterol y la presin arterial. Esta informacin no tiene Marine scientist el consejo del mdico. Asegrese de hacerle al mdico cualquier pregunta que tenga. Document Revised: 03/17/2018 Document Reviewed: 03/17/2018 Elsevier Patient Education  [place].

## 2020-06-27 NOTE — Assessment & Plan Note (Signed)
Well-controlled hypertension.  Continue Hyzaar 50-12.5 mg daily. Diet and nutrition discussed. Follow-up in 6 months.

## 2020-08-30 ENCOUNTER — Other Ambulatory Visit: Payer: Self-pay | Admitting: Emergency Medicine

## 2020-08-30 DIAGNOSIS — I10 Essential (primary) hypertension: Secondary | ICD-10-CM

## 2020-12-27 ENCOUNTER — Ambulatory Visit: Payer: Self-pay | Admitting: Emergency Medicine

## 2021-04-04 ENCOUNTER — Other Ambulatory Visit: Payer: Self-pay | Admitting: Emergency Medicine

## 2021-04-04 DIAGNOSIS — K649 Unspecified hemorrhoids: Secondary | ICD-10-CM

## 2021-05-04 ENCOUNTER — Other Ambulatory Visit: Payer: Self-pay | Admitting: Emergency Medicine

## 2021-05-04 DIAGNOSIS — I10 Essential (primary) hypertension: Secondary | ICD-10-CM

## 2021-05-30 ENCOUNTER — Ambulatory Visit (INDEPENDENT_AMBULATORY_CARE_PROVIDER_SITE_OTHER): Payer: BC Managed Care – PPO

## 2021-05-30 ENCOUNTER — Encounter: Payer: Self-pay | Admitting: Emergency Medicine

## 2021-05-30 ENCOUNTER — Other Ambulatory Visit: Payer: Self-pay

## 2021-05-30 ENCOUNTER — Ambulatory Visit (INDEPENDENT_AMBULATORY_CARE_PROVIDER_SITE_OTHER): Payer: BC Managed Care – PPO | Admitting: Emergency Medicine

## 2021-05-30 VITALS — BP 116/70 | HR 78 | Temp 99.0°F | Ht 65.0 in | Wt 157.0 lb

## 2021-05-30 DIAGNOSIS — R051 Acute cough: Secondary | ICD-10-CM | POA: Diagnosis not present

## 2021-05-30 DIAGNOSIS — J22 Unspecified acute lower respiratory infection: Secondary | ICD-10-CM | POA: Diagnosis not present

## 2021-05-30 DIAGNOSIS — R062 Wheezing: Secondary | ICD-10-CM

## 2021-05-30 DIAGNOSIS — R059 Cough, unspecified: Secondary | ICD-10-CM | POA: Diagnosis not present

## 2021-05-30 MED ORDER — HYDROCODONE BIT-HOMATROP MBR 5-1.5 MG/5ML PO SOLN: 5.0000 mL | Freq: Every evening | ORAL | 0 refills | Status: DC | PRN

## 2021-05-30 MED ORDER — PREDNISONE 20 MG PO TABS: 20.0000 mg | ORAL_TABLET | Freq: Every day | ORAL | 0 refills | Status: AC

## 2021-05-30 MED ORDER — AZITHROMYCIN 250 MG PO TABS: ORAL_TABLET | ORAL | 0 refills | Status: DC

## 2021-05-30 NOTE — Patient Instructions (Signed)
Bronquitis aguda en los adultos ?Acute Bronchitis, Adult ?La bronquitis aguda es la inflamaci?n repentina de las v?as a?reas (bronquios) de los pulmones. Esta afecci?n puede dificultar la respiraci?n. En los adultos, la bronquitis aguda generalmente desaparece en 2 semanas. La tos provocada por la bronquitis puede durar hasta 3 semanas. El h?bito de fumar, las Set designer y el asma pueden empeorar esta afecci?n. ??Cu?les son las causas? ?Los microbios que causan el resfr?o y la gripe (virus). La causa m?s frecuente de esta afecci?n es el virus que provoca el resfr?o com?n. ?Bacterias. ?Sustancias que molestan (irritan) los pulmones, lo que incluye: ?Humo de cigarrillos y otros productos de tabaco. ?Polvo y polen. ?Vapores de productos qu?micos, gases o combustible quemado. ?Contaminaci?n del aire interior o exterior. ??Qu? incrementa el riesgo? ?El sistema de defensa del cuerpo debilitado. Este tambi?n se denomina sistema inmunitario. ?Cualquier afecci?n que afecte a los pulmones y la respiraci?n, como el asma. ??Cu?les son los signos o s?ntomas? ?Tos. ?Despedir Ardelia Mems mucosidad transparente, amarilla o verde al toser. ?Emitir sonidos de silbidos agudos al respirar, m?s a menudo al exhalar (sibilancias). ?Secreci?n o congesti?n nasal. ?Exceso de mucosidad en los pulmones (congesti?n tor?cica). ?Falta de aire. ?Dolores [employer]. ?Dolor de Investment banker, operational. ??C?mo se trata? ?La bronquitis aguda puede desaparecer con [employer], sin tratamiento. Su m?dico puede recomendarle lo siguiente: ?Beba m?s l?quidos. Esto ayudar? a diluir la mucosidad de modo que sea m?s f?cil expectorarla. ?Usar un dispositivo que Contractor en los pulmones (inhalador). ?Utilizar un humidificador o vaporizador. Estas son m?quinas que agregan agua al [employer]. Esto ayuda con la tos y con la respiraci?n deficiente. ?Tomar un medicamento que diluya la mucosidad y ayude a eliminarla de los pulmones. ?Tomar un medicamento que prevenga o detenga la  tos. ?No es frecuente tomar un antibi?tico para esta afecci?n. ?Siga estas indicaciones en su casa: ? ?Use los medicamentos de venta libre y los recetados solamente como se lo haya indicado el m?dico. ?Use un inhalador, un humidificador o un vaporizador tal como se lo haya indicado el m?dico. ?[employer] cucharaditas (10 ml) de miel a la hora de [place]. Esto ayuda a disminuir la tos por la noche. ?Beba suficiente l?quido para mantener el pis (la orina) de color amarillo p?lido. ?No fume ni consuma ning?n producto que contenga nicotina o tabaco. Si necesita ayuda para dejar de fumar, consulte al m?dico. ?Descanse lo suficiente. ?Regrese a sus actividades normales cuando el m?dico le diga que es [place]. ?Concurra a [place]. ??C?mo se previene? ? ?L?vese las manos frecuentemente con agua y jab?n durante al menos 20 segundos. Use un desinfectante para manos si no dispone de agua y jab?n. ?Evite el contacto con personas que tienen s?ntomas de resfr?o. ?Trate de no llevarse las manos a la boca, la nariz o los ojos. ?Evite inhalar humo o vapores qu?micos. ?Recuerde aplicarse la vacuna contra la gripe todos los a?os. ?Comun?quese con un m?dico si: ?Los s?ntomas no mejoran en el t?rmino de 2 semanas. ?Tiene dificultad para expulsar la mucosidad al toser. ?La tos lo mantiene despierto por la noche. ?Tiene fiebre. ?Solicite ayuda de inmediato si: ?Tose y escupe sangre. ?Siente dolor en el pecho. ?Sufre un episodio muy intenso de falta de aire. ?Se desmaya o se siente como si se fuera a desmayar. ?Tiene un dolor de cabeza muy intenso. ?La fiebre o los escalofr?os empeoran. ?Estos s?ntomas pueden Sales executive. Solicite ayuda de inmediato. Comun?quese con el servicio de emergencias de su localidad (911 en los [place]). ?No espere a ver si los s?ntomas desaparecen. ?No conduzca por sus propios medios [employer]. ?Resumen ?La bronquitis aguda es la inflamaci?n repentina de las v?as  a?reas (bronquios) de los pulmones. En los adultos, la bronquitis aguda generalmente desaparece en 2 semanas. ?Beba m?s l?quidos. Esto ayudar? a diluir la mucosidad de modo que sea m?s f?cil expectorarla. ?Use los medicamentos de venta libre y los recetados solamente como se lo haya indicado el m?dico. ?Comun?quese con un m?dico si los s?ntomas no mejoran despu?s de 2 semanas de tratamiento. ?Esta informaci?n no tiene Marine scientist el consejo del m?dico. Aseg?rese de hacerle al m?dico cualquier pregunta que tenga. ?Document Revised: 07/16/2020 Document Reviewed: 07/16/2020 ?Elsevier Patient Education ? [place]. ? ?
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
## 2021-06-04 ENCOUNTER — Telehealth: Payer: Self-pay | Admitting: Emergency Medicine

## 2021-06-04 ENCOUNTER — Other Ambulatory Visit: Payer: Self-pay | Admitting: Emergency Medicine

## 2021-06-04 DIAGNOSIS — J439 Emphysema, unspecified: Secondary | ICD-10-CM

## 2021-06-04 MED ORDER — BREZTRI AEROSPHERE 160-9-4.8 MCG/ACT IN AERO: 2.0000 | INHALATION_SPRAY | Freq: Two times a day (BID) | RESPIRATORY_TRACT | 11 refills | Status: DC

## 2021-06-04 NOTE — Telephone Encounter (Signed)
Pt states he is still experiencing a severe cough w/ chest congestion ? ?Pt states he has taking medication prescribed on 3-23 as directed ? ?Pt inquiring if another ov is needed or provider's recommendations ? ?Pt requesting a cb ? ? ?

## 2021-06-04 NOTE — Telephone Encounter (Signed)
Recommend to start inhaler, Breztri twice a day.  Prescription sent to pharmacy of record today. ?Infection still needs to run its course and it will take longer.  Office visit next week if no better.  Thanks.

## 2021-06-04 NOTE — Telephone Encounter (Signed)
Called patient and left voice message with provider recommendations.  ?

## 2021-06-07 NOTE — Telephone Encounter (Signed)
Pt requesting a work excuse, excusing him from work from 06-04-2021 to  06-07-2021 ? ?Pt was seen in office on 05-30-2021 ? ?Pt states he is feeling a lot better ? ?Please advise ?

## 2021-06-10 NOTE — Telephone Encounter (Signed)
Called patient and left message that work note has been written and is up front and ready for pick up  ? ? ?

## 2021-06-10 NOTE — Telephone Encounter (Signed)
Okay to provide work note as requested.  Thanks.

## 2022-04-29 IMAGING — DX DG CHEST 2V
2 series · 2 of 2 positions shown · non-contrast
Comparison: None

CLINICAL DATA: Cough for 2 weeks.

EXAM:
CHEST - 2 VIEW

[chest pa]
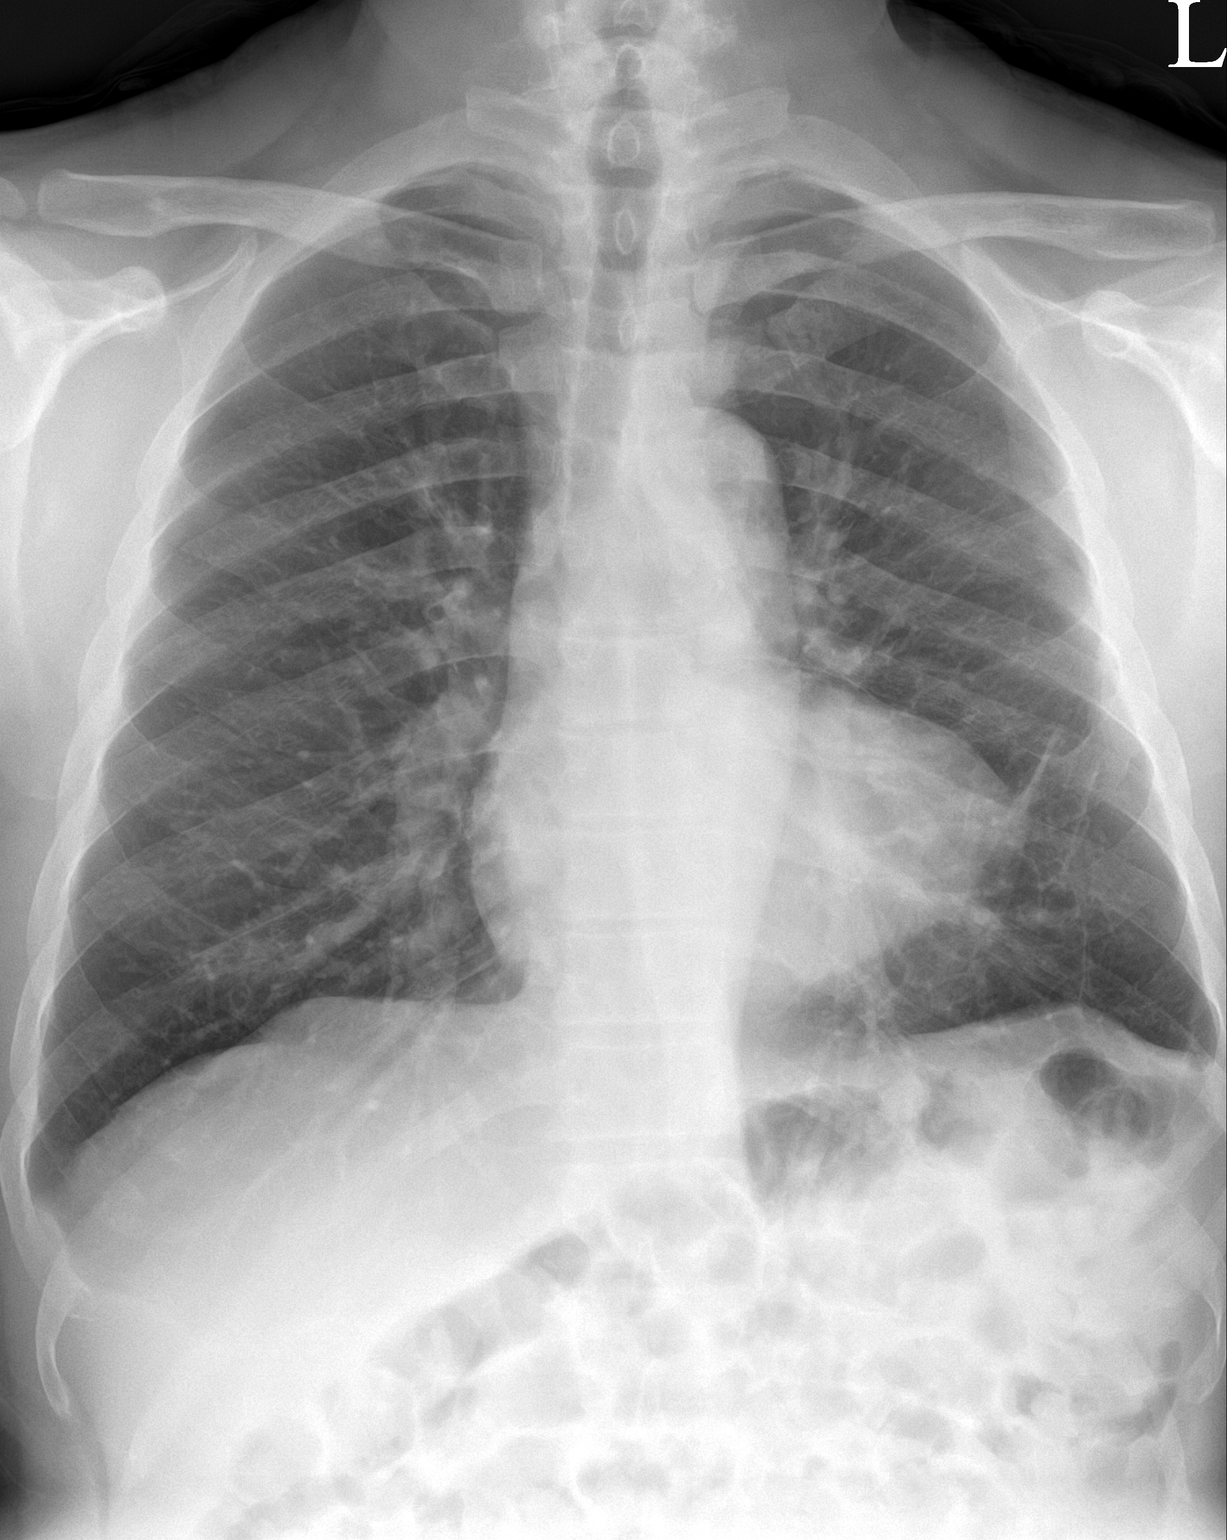

[chest lat]
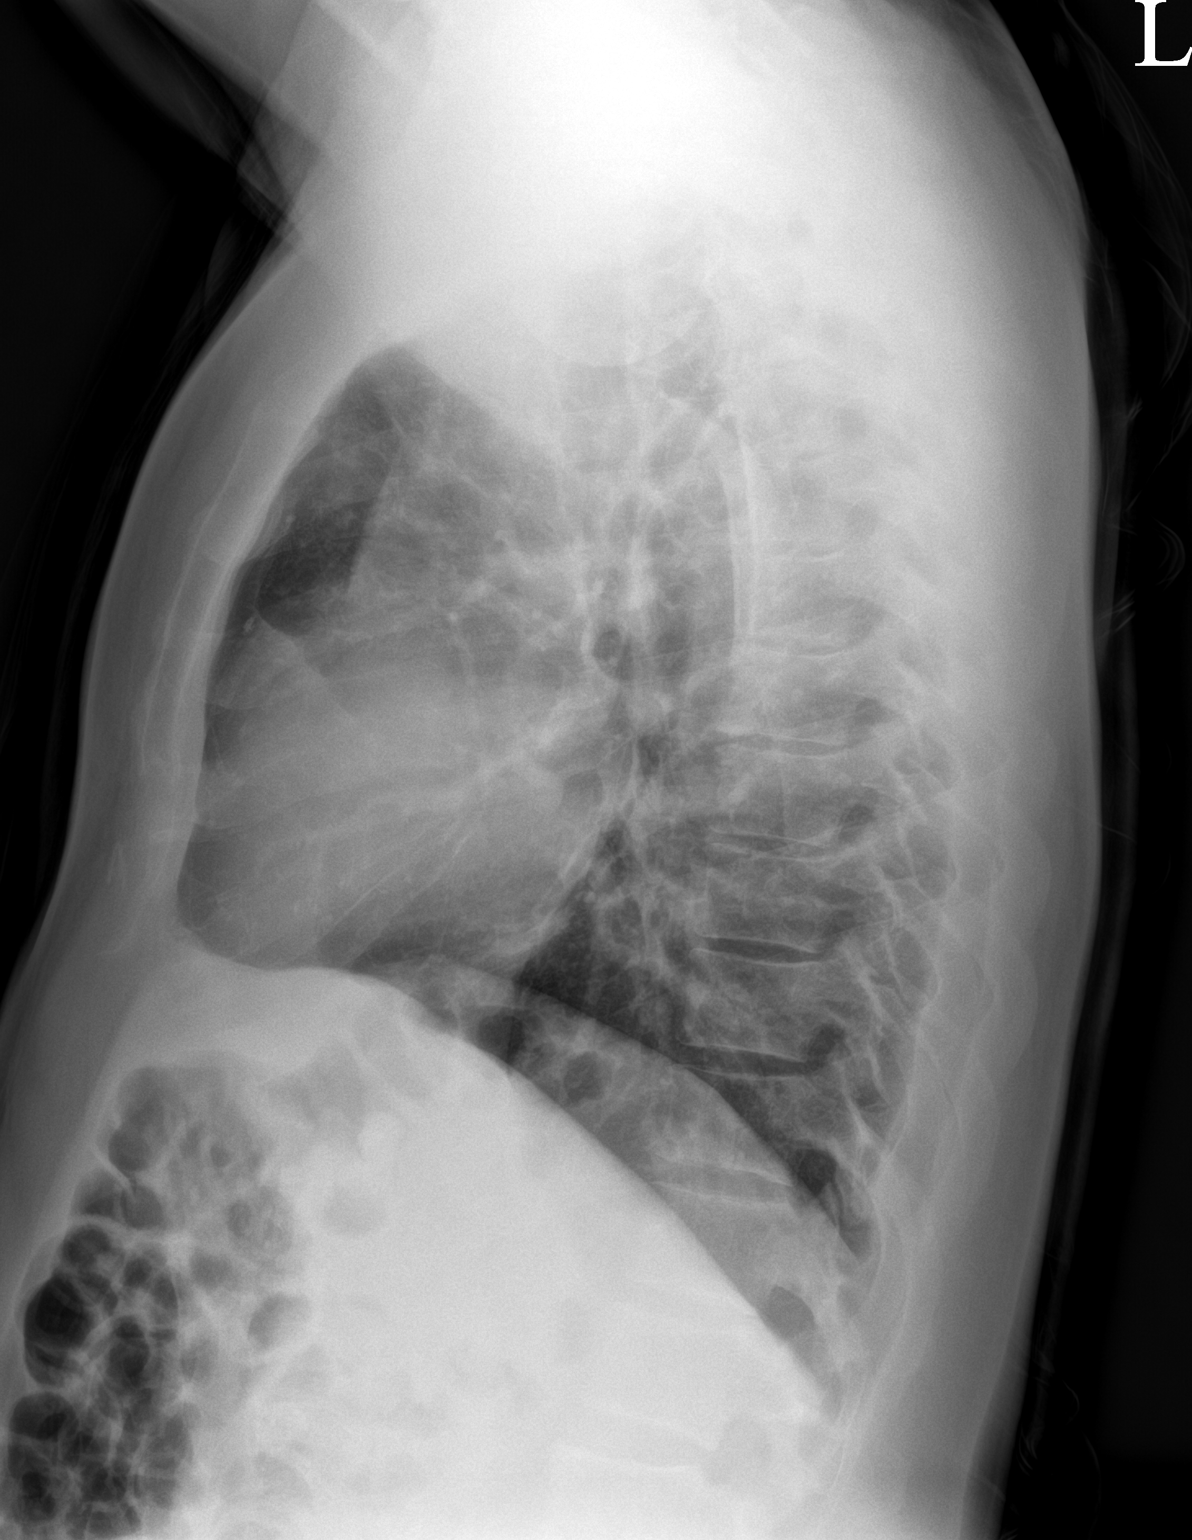

[2 of 2 positions shown; findings below may reference images not displayed]

FINDINGS: Trachea is midline.

Cardiomediastinal contours and hilar structures are normal. There is
lucency below the LEFT heart favored to be related to
hyperinflation. There is no visible pneumothorax. No lobar
consolidation. No sign of pleural effusion.

Bandlike changes are present along the LEFT heart border.

On limited assessment there is no acute skeletal process.
IMPRESSION: Bandlike changes in the LEFT lung base favored represent scarring
likely associated with emphysematous changes.

Mild hyperinflation without lobar consolidation or sign of pleural
effusion.

## 2022-05-06 ENCOUNTER — Ambulatory Visit (INDEPENDENT_AMBULATORY_CARE_PROVIDER_SITE_OTHER): Payer: BC Managed Care – PPO | Admitting: Emergency Medicine

## 2022-05-06 ENCOUNTER — Encounter: Payer: Self-pay | Admitting: Emergency Medicine

## 2022-05-06 VITALS — BP 130/82 | HR 85 | Temp 98.5°F | Ht 65.0 in | Wt 162.0 lb

## 2022-05-06 DIAGNOSIS — Z1329 Encounter for screening for other suspected endocrine disorder: Secondary | ICD-10-CM

## 2022-05-06 DIAGNOSIS — Z1159 Encounter for screening for other viral diseases: Secondary | ICD-10-CM | POA: Diagnosis not present

## 2022-05-06 DIAGNOSIS — Z130 Encounter for screening for diseases of the blood and blood-forming organs and certain disorders involving the immune mechanism: Secondary | ICD-10-CM

## 2022-05-06 DIAGNOSIS — I10 Essential (primary) hypertension: Secondary | ICD-10-CM | POA: Diagnosis not present

## 2022-05-06 DIAGNOSIS — Z13228 Encounter for screening for other metabolic disorders: Secondary | ICD-10-CM | POA: Diagnosis not present

## 2022-05-06 DIAGNOSIS — Z114 Encounter for screening for human immunodeficiency virus [HIV]: Secondary | ICD-10-CM | POA: Diagnosis not present

## 2022-05-06 DIAGNOSIS — Z0000 Encounter for general adult medical examination without abnormal findings: Secondary | ICD-10-CM | POA: Diagnosis not present

## 2022-05-06 DIAGNOSIS — Z13220 Encounter for screening for lipoid disorders: Secondary | ICD-10-CM

## 2022-05-06 MED ORDER — LOSARTAN POTASSIUM-HCTZ 50-12.5 MG PO TABS: 1.0000 | ORAL_TABLET | Freq: Every day | ORAL | 3 refills | Status: DC
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
## 2022-05-06 NOTE — Patient Instructions (Signed)
Mantenimiento de Teacher, English as a foreign language, Male Adoptar un estilo de vida saludable y recibir atencin preventiva son importantes para promover la salud y Musician. Consulte al mdico sobre: El esquema adecuado para hacerse pruebas y exmenes peridicos. Cosas que puede hacer por su cuenta para prevenir enfermedades y [place] sano. Qu debo saber sobre la dieta, el peso y el ejercicio? Consuma una dieta saludable  Consuma una dieta que incluya muchas verduras, frutas, productos lcteos con bajo contenido de [place] y Advertising account planner. No consuma muchos alimentos ricos en grasas slidas, azcares agregados o sodio. Mantenga un peso saludable El ndice de masa muscular [facility]) es una medida que puede utilizarse para identificar posibles problemas de [place]. Proporciona una estimacin de la grasa corporal basndose en el peso y la altura. Su mdico puede ayudarle a Radiation protection practitioner [place] y a Scientist, forensic o Theatre manager un peso saludable. Haga ejercicio con regularidad Haga ejercicio con regularidad. Esta es una de las prcticas ms importantes que puede hacer por su salud. La mayora de los adultos deben seguir estas pautas: Optometrist, al menos, 126mnutos de actividad fsica por semana. El ejercicio debe aumentar la frecuencia cardaca y hNature conservation officertranspirar (ejercicio de intensidad moderada). Hacer ejercicios de fortalecimiento por lo mHalliburton Companypor semana. Agregue esto a su plan de ejercicio de intensidad moderada. Pase menos tiempo sentado. Incluso la actividad fsica ligera puede ser beneficiosa. Controle sus niveles de colesterol y lpidos en la sangre Comience a realizarse anlisis de lpidos y cResearch officer, trade unionen la sangre a los 20aos y luego reptalos cada 5aos. Es posible que nAutomotive engineerlos niveles de colesterol con mayor frecuencia si: Sus niveles de lpidos y colesterol son altos. Es mayor de 40aos. Presenta un alto riesgo de padecer enfermedades cardacas. Qu debo  saber sobre las pruebas de deteccin del cncer? Muchos tipos de cncer pueden detectarse de manera temprana y, a menudo, pueden prevenirse. Segn su historia clnica y sus antecedentes familiares, es posible que deba realizarse pruebas de deteccin del cncer en diferentes edades. Esto puede incluir pruebas de deteccin de lo siguiente: CSurveyor, minerals Cncer de prstata. Cncer de piel. Cncer de pulmn. Qu debo saber sobre la enfermedad cardaca, la diabetes y la hipertensin arterial? Presin arterial y enfermedad cardaca La hipertensin arterial causa enfermedades cardacas y aSerbiael riesgo de accidente cerebrovascular. Es ms probable que esto se manifieste en las personas que tienen lecturas de presin arterial alta o tienen sobrepeso. Hable con el mdico sobre sus valores de presin arterial deseados. Hgase controlar la presin arterial: Cada 3 a 5 aos si tiene entre 18 y 374aos. Todos los aos si es mayor de [place] Si tiene entre 663y 720aos y es fumador o sInsurance account manager pregntele al mdico si debe realizarse una prueba de deteccin de aneurisma artico abdominal (AAA) por nica vez. Diabetes Realcese exmenes de deteccin de la diabetes con regularidad. Este anlisis revisa el nivel de azcar en la sangre en [place] Hgase las pruebas de deteccin: Cada tresaos despus de los 417aosde edad si tiene un peso normal y un bajo riesgo de padecer diabetes. Con ms frecuencia y a partir de uCiceroedad inferior si tiene sobrepeso o un alto riesgo de padecer diabetes. Qu debo saber sobre la prevencin de infecciones? Hepatitis B Si tiene un riesgo ms alto de contraer hepatitis B, debe someterse a un examen de deteccin de este virus. Hable con el mdico para averiguar si tiene riesgo de contraer la infeccin por hepatitis B. Hepatitis  C Se recomienda un anlisis de [place] para: Todos los que nacieron entre 1945 y [phone]. Todas las personas que tengan un riesgo de haber  contrado hepatitis C. Enfermedades de transmisin sexual (ETS) Debe realizarse pruebas de deteccin de ITS todos los aos, incluidas la gonorrea y la clamidia, si: Es sexualmente activo y es menor de 24aos. Es mayor de 24aos, y Investment banker, operational informa que corre riesgo de tener este tipo de infecciones. La actividad sexual ha cambiado desde que le hicieron la ltima prueba de deteccin y tiene un riesgo mayor de Best boy clamidia o Radio broadcast assistant. Pregntele al mdico si usted tiene riesgo. Pregntele al mdico si usted tiene un alto riesgo de Museum/gallery curator VIH. El mdico tambin puede recomendarle un medicamento recetado para ayudar a evitar la infeccin por el VIH. Si elige tomar medicamentos para prevenir el VIH, primero debe [employer] de deteccin del VIH. Luego debe hacerse anlisis cada 60mses mientras est tomando los medicamentos. Siga estas indicaciones en su casa: Consumo de alcohol No beba alcohol si el mdico se lo prohbe. Si bebe alcohol: Limite la cantidad que consume de 0 a 2 bebidas por da. Sepa cunta cantidad de alcohol hay en las bebidas que toma. En los [place] una medida equivale a una botella de cerveza de 12oz (3547m, un vaso de vino de 5oz (14868mo un vaso de una bebida alcohlica de alta graduacin de 1oz (67m8mEstilo de vida No consuma ningn producto que contenga nicotina o tabaco. Estos productos incluyen cigarrillos, tabaco para mascHigher education careers adviserparatos de vapeo, como los cigaPsychologist, sport and exercise necesita ayuda para dejar de consumir estos productos, consulte al mdico. No consuma drogas. No comparta agujas. Solicite ayuda a su mdico si necesita apoyo o informacin para abandonar las drogas. Indicaciones generales Realcese los estudios de rutina de la salud, dentales y de la vPublic librarianntFrostprooffrmele a su mdico si: Se siente deprimido con frecuencia. Alguna vez ha sido vctima de maltKenny Lakeo se siente seguro en su  casa. Resumen Adoptar un estilo de vida saludable y recibir atencin preventiva son importantes para promover la salud y el bMusicianga las instrucciones del mdico acerca de una dieta saludable, el ejercicio y la realizacin de pruebas o exmenes para deteEngineer, building servicesga las instrucciones del mdico con respecto al control del colesterol y la presin arterial. Esta informacin no tiene comoMarine scientistconsejo del mdico. Asegrese de hacerle al mdico cualquier pregunta que tenga. Document Revised: 08/01/2020 Document Reviewed: 08/01/2020 Elsevier Patient Education  2023[place]

## 2022-05-06 NOTE — Assessment & Plan Note (Signed)
Well-controlled hypertension. BP Readings from Last 3 Encounters:  05/06/22 130/82  05/30/21 116/70  06/27/20 120/70  Cardiovascular risks associated with hypertension discussed. Continue Hyzaar 50-12.5 mg daily Refill sent to pharmacy Dietary approaches to stop hypertension discussed

## 2022-05-07 LAB — CBC WITH DIFFERENTIAL/PLATELET
Basophils Absolute: 0.1 10*3/uL (ref 0.0–0.1)
Basophils Relative: 1 % (ref 0.0–3.0)
Eosinophils Absolute: 0.2 10*3/uL (ref 0.0–0.7)
Eosinophils Relative: 2 % (ref 0.0–5.0)
HCT: 45.1 % (ref 39.0–52.0)
Hemoglobin: 15.1 g/dL (ref 13.0–17.0)
Lymphocytes Relative: 17.8 % (ref 12.0–46.0)
Lymphs Abs: 1.8 10*3/uL (ref 0.7–4.0)
MCHC: 33.6 g/dL (ref 30.0–36.0)
MCV: 90.5 fl (ref 78.0–100.0)
Monocytes Absolute: 1 10*3/uL (ref 0.1–1.0)
Monocytes Relative: 10.6 % (ref 3.0–12.0)
Neutro Abs: 6.8 10*3/uL (ref 1.4–7.7)
Neutrophils Relative %: 68.6 % (ref 43.0–77.0)
Platelets: 362 10*3/uL (ref 150.0–400.0)
RBC: 4.98 Mil/uL (ref 4.22–5.81)
RDW: 12.9 % (ref 11.5–15.5)
WBC: 9.9 10*3/uL (ref 4.0–10.5)

## 2022-05-07 LAB — LIPID PANEL
Cholesterol: 194 mg/dL (ref 0–200)
HDL: 60.8 mg/dL (ref >39.00)
LDL Cholesterol: 101 mg/dL — ABNORMAL HIGH (ref 0–99)
NonHDL: 133.18
Total CHOL/HDL Ratio: 3
Triglycerides: 161 mg/dL — ABNORMAL HIGH (ref 0.0–149.0)
VLDL: 32.2 mg/dL (ref 0.0–40.0)

## 2022-05-07 LAB — COMPREHENSIVE METABOLIC PANEL
ALT: 26 U/L (ref 0–53)
AST: 23 U/L (ref 0–37)
Albumin: 4.6 g/dL (ref 3.5–5.2)
Alkaline Phosphatase: 81 U/L (ref 39–117)
BUN: 23 mg/dL (ref 6–23)
CO2: 34 mEq/L — ABNORMAL HIGH (ref 19–32)
Calcium: 10.8 mg/dL — ABNORMAL HIGH (ref 8.4–10.5)
Chloride: 99 mEq/L (ref 96–112)
Creatinine, Ser: 1.36 mg/dL (ref 0.40–1.50)
GFR: 59.24 mL/min — ABNORMAL LOW (ref >60.00)
Glucose, Bld: 93 mg/dL (ref 70–99)
Potassium: 4.7 mEq/L (ref 3.5–5.1)
Sodium: 141 mEq/L (ref 135–145)
Total Bilirubin: 0.4 mg/dL (ref 0.2–1.2)
Total Protein: 7.9 g/dL (ref 6.0–8.3)

## 2022-05-07 LAB — HEMOGLOBIN A1C: Hgb A1c MFr Bld: 6 % (ref 4.6–6.5)

## 2022-05-07 LAB — HEPATITIS C ANTIBODY: Hepatitis C Ab: NONREACTIVE

## 2022-05-07 LAB — HIV ANTIBODY (ROUTINE TESTING W REFLEX): HIV 1&2 Ab, 4th Generation: NONREACTIVE

## 2022-07-28 ENCOUNTER — Other Ambulatory Visit: Payer: Self-pay | Admitting: Emergency Medicine

## 2022-07-28 DIAGNOSIS — I10 Essential (primary) hypertension: Secondary | ICD-10-CM

## 2022-10-04 ENCOUNTER — Other Ambulatory Visit: Payer: Self-pay | Admitting: Emergency Medicine

## 2022-10-04 DIAGNOSIS — J439 Emphysema, unspecified: Secondary | ICD-10-CM

## 2022-12-26 ENCOUNTER — Other Ambulatory Visit: Payer: Self-pay | Admitting: Emergency Medicine

## 2022-12-26 DIAGNOSIS — K649 Unspecified hemorrhoids: Secondary | ICD-10-CM

## 2023-05-11 ENCOUNTER — Other Ambulatory Visit: Payer: Self-pay | Admitting: Emergency Medicine

## 2023-05-11 DIAGNOSIS — I10 Essential (primary) hypertension: Secondary | ICD-10-CM
